# Patient Record
Sex: Male | Born: 1950
Health system: Southern US, Community
[De-identification: ages and names within clinical notes are randomized; demographics above are authoritative.]

## PROBLEM LIST (undated history)

## (undated) DIAGNOSIS — E119 Type 2 diabetes mellitus without complications: Secondary | ICD-10-CM

## (undated) DIAGNOSIS — J189 Pneumonia, unspecified organism: Secondary | ICD-10-CM

## (undated) HISTORY — PX: APPENDECTOMY: SHX54

## (undated) HISTORY — DX: Type 2 diabetes mellitus without complications: E11.9

## (undated) HISTORY — DX: Pneumonia, unspecified organism: J18.9

---

## 1981-01-09 HISTORY — PX: BACK SURGERY: SHX140

## 2003-11-29 ENCOUNTER — Inpatient Hospital Stay: Payer: Self-pay | Admitting: Anesthesiology

## 2004-02-12 ENCOUNTER — Ambulatory Visit: Payer: Self-pay | Admitting: Internal Medicine

## 2010-02-18 ENCOUNTER — Ambulatory Visit: Payer: Self-pay | Admitting: General Practice

## 2010-02-24 ENCOUNTER — Other Ambulatory Visit: Payer: Self-pay | Admitting: Orthopedic Surgery

## 2010-02-24 DIAGNOSIS — M542 Cervicalgia: Secondary | ICD-10-CM

## 2010-02-25 ENCOUNTER — Ambulatory Visit
Admission: RE | Admit: 2010-02-25 | Discharge: 2010-02-25 | Disposition: A | Discharge status: Home / Self Care | Payer: PRIVATE HEALTH INSURANCE | Source: Ambulatory Visit | Attending: Orthopedic Surgery | Admitting: Orthopedic Surgery

## 2010-02-25 DIAGNOSIS — M542 Cervicalgia: Secondary | ICD-10-CM

## 2010-03-29 ENCOUNTER — Other Ambulatory Visit: Payer: Self-pay | Admitting: Orthopedic Surgery

## 2010-03-29 DIAGNOSIS — M542 Cervicalgia: Secondary | ICD-10-CM

## 2010-03-31 ENCOUNTER — Other Ambulatory Visit: Payer: Self-pay | Admitting: Orthopedic Surgery

## 2010-04-01 ENCOUNTER — Other Ambulatory Visit: Payer: PRIVATE HEALTH INSURANCE

## 2010-04-08 ENCOUNTER — Other Ambulatory Visit: Payer: PRIVATE HEALTH INSURANCE

## 2014-08-31 ENCOUNTER — Encounter: Payer: PPO | Attending: Internal Medicine | Admitting: Dietician

## 2014-08-31 ENCOUNTER — Encounter: Payer: Self-pay | Admitting: Dietician

## 2014-08-31 VITALS — BP 137/84 | Ht 69.0 in | Wt 185.7 lb

## 2014-08-31 DIAGNOSIS — E119 Type 2 diabetes mellitus without complications: Secondary | ICD-10-CM | POA: Diagnosis present

## 2014-08-31 DIAGNOSIS — E109 Type 1 diabetes mellitus without complications: Secondary | ICD-10-CM

## 2014-08-31 NOTE — Progress Notes (Signed)
Diabetes Self-Management Education  Visit Type: First/Initial  Appt. Start Time: 1330 Appt. End Time: 1515  08/31/2014  [name], identified by name and date of birth, is a 64 y.o. male with a diagnosis of Diabetes:  (?type 1).   ASSESSMENT  Blood pressure 137/84, height 5\' 9"  (1.753 m), weight 185 lb 11.2 oz (84.233 kg). Body mass index is 27.41 kg/(m^2).      Diabetes Self-Management Education - 08/31/14 1633    Visit Information   Visit Type First/Initial   Initial Visit   Diabetes Type --  ?type 1   Health Coping   How would you rate your overall health? Good   Psychosocial Assessment   Patient Belief/Attitude about Diabetes Motivated to manage diabetes   Self-care barriers None   Self-management support Family   Other persons present Spouse/SO   Patient Concerns Nutrition/Meal planning;Medication;Healthy Lifestyle;Glycemic Control   Special Needs None   Preferred Learning Style Visual;Auditory   Learning Readiness Ready   What is the last grade level you completed in school? 12 + trade school   Complications   Last HgB A1C per patient/outside source 12.6 %  06-17-14   How often do you check your blood sugar? 3-4 times/day  4x/day   Fasting Blood glucose range (mg/dL) --  95-621; ac lunch 30-865; ac supper 76-198; bedtime 104-163   Number of hypoglycemic episodes per month 1   Can you tell when your blood sugar is low? Yes   What do you do if your blood sugar is low? --  drinks fruit juice or eats lifesavers   Number of hyperglycemic episodes per week 1   Can you tell when your blood sugar is high? Yes   What do you do if your blood sugar is high? takes sliding scale Humalog if BG's >150   Have you had a dilated eye exam in the past 12 months? No   Have you had a dental exam in the past 12 months? No   Are you checking your feet? 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 --  2-3 days/wk   Patient Education   Previous Diabetes Education No   Disease state  Definition of diabetes, type 1 and 2, and the diagnosis of diabetes;Factors that contribute to the development of diabetes   Nutrition management  Role of diet in the treatment of diabetes and the relationship between the three main macronutrients and blood glucose level;Food label reading, portion sizes and measuring food.;Carbohydrate counting;Meal timing in regards to the patients' current diabetes medication.   Physical activity and exercise  Role of exercise on diabetes management, blood pressure control and cardiac health.   Medications Taught/reviewed insulin injection, site rotation, insulin storage and needle disposal.;Reviewed patients medication for diabetes, action, purpose, timing of dose and side effects.   Monitoring Purpose and frequency of SMBG.;Taught/discussed recording of test results and interpretation of SMBG.;Identified appropriate SMBG and/or A1C goals.;Yearly dilated eye exam   Acute complications Taught treatment of hypoglycemia - the 15 rule.   Chronic complications Relationship between chronic complications and blood glucose control;Dental care;Retinopathy and reason for yearly dilated eye exams   Personal strategies to promote health Lifestyle issues that need to be  addressed for better diabetes care      Individualized Plan for Diabetes Self-Management Training:   Learning Objective:  Patient will have a greater understanding of diabetes self-management. Patient education plan is to attend individual and/or group sessions per assessed needs and concerns.   Plan:   Patient Instructions   Check blood sugars 4 x day before each meal and before bed every day and record  Avoid sugar sweetened drinks (soda, tea, coffee, sports drinks, juices) and limit intake of  sweets and fried foods  Eat 3 meals day,  3  snacks a day  Space meals 4-6 hours apart  Complete 3 Day Food Record and bring to next appt  Make a podiatrist / dentist / eye doctor appointment  Bring blood sugar records to the next appointment  Carry fast acting glucose and a snack at all times  Rotate injection sites  Carry medical ID at all times  Return for appointment/classes on:  09-18-14   Expected Outcomes:   Good  Education material provided: General meal planning guidelines, medical alert ID card, 1 roll of glucose tablets for use if needed, 1 pack of 4mm pen needles, 1 pack 6mm 30 unit insulin syringes, list of diabetes websites and apps  If problems or questions, patient to contact team via:  [phone]  Future DSME appointment:  09-18-14

## 2014-08-31 NOTE — Patient Instructions (Addendum)
  Check blood sugars 4 x day before each meal and before bed every day and record  Avoid sugar sweetened drinks (soda, tea, coffee, sports drinks, juices) and limit intake of sweets and fried foods  Eat 3 meals day,  3  snacks a day  Space meals 4-6 hours apart  Complete 3 Day Food Record and bring to next appt  Make a podiatrist / dentist / eye doctor appointment  Bring blood sugar records to the next appointment  Carry fast acting glucose and a snack at all times  Rotate injection sites  Carry medical ID at all times  Return for appointment/classes on:  09-18-14

## 2014-09-08 ENCOUNTER — Encounter: Payer: PPO | Admitting: Dietician

## 2014-09-08 VITALS — Wt 184.4 lb

## 2014-09-08 DIAGNOSIS — E119 Type 2 diabetes mellitus without complications: Secondary | ICD-10-CM | POA: Diagnosis not present

## 2014-09-08 NOTE — Progress Notes (Signed)
Diabetes Self-Management Education  Visit Type:  Follow-up  Appt. Start Time: 13;30 Appt. End Time: 14:30  09/08/2014  [name], identified by name and date of birth, is a 64 y.o. male with a diagnosis of Diabetes:  .   ASSESSMENT  Weight 184 lb 6.4 oz (83.643 kg). Body mass index is 27.22 kg/(m^2).       Diabetes Self-Management Education - 09/08/14 1457    Complications   Last HgB A1C per patient/outside source 7.6 %   How often do you check your blood sugar? 3-4 times/day   Fasting Blood glucose range (mg/dL) 60-454   Postprandial Blood glucose range (mg/dL) 098-119   Have you had a dilated eye exam in the past 12 months? No   Have you had a dental exam in the past 12 months? No   Are you checking your feet? No   Dietary Intake   Breakfast 3 eggs, 1 oz. shredded cheese, 1 slice ww toast, 1 tsp honey, 8 oz soy milk, black coffee    Snack (morning) toast or crackers, pecans or peanut butter, fruit   Lunch 4-6 oz. chicken or pork for ex., 3/4 to 1 cup starch such as potatoes or sweet potatoes, non-starchy veg.   Snack (afternoon) 1 cup cottage cheese, 2 pear halves or apple/peanut butter   Dinner 4 oz. 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   Acute complications Taught treatment of hypoglycemia - the 15 rule.   Chronic  complications Lipid levels, blood glucose control and heart disease      Learning Objective:  Patient will have a greater understanding of diabetes self-management. Patient education plan is to attend individual and/or group sessions per assessed needs and concerns.   Plan:   Patient Instructions  Decrease from 3 eggs per day to 2 + egg whites. Spread 13-15 servings of carbohydrate over 3 meals and 2-3 snacks. Balance meals with protein, 3-4 servings of carbohydrate and "free vegetables". Read labels for carbohydrate; can subtract fiber. Include 10 oz. protein per day.   Education material provided: Planning a Balanced Meal and 3 day menus If problems or questions, patient to contact team via:  Phone: Margit Hanks [phone] Future DSME appointment: -   Sept. 12, 2016 at 13:00 with [place] Blas, RN

## 2014-09-08 NOTE — Patient Instructions (Signed)
Decrease from 3 eggs per day to 2 + egg whites. Spread 13-15 servings of carbohydrate over 3 meals and 2-3 snacks. Balance meals with protein, 3-4 servings of carbohydrate and "free vegetables". Read labels for carbohydrate; can subtract fiber. Include 10 oz. protein per day.

## 2014-09-18 ENCOUNTER — Ambulatory Visit: Payer: PPO | Admitting: Dietician

## 2014-09-21 ENCOUNTER — Encounter: Payer: Self-pay | Admitting: Dietician

## 2014-09-21 ENCOUNTER — Encounter: Payer: PPO | Attending: Internal Medicine | Admitting: Dietician

## 2014-09-21 VITALS — Wt 186.2 lb

## 2014-09-21 DIAGNOSIS — E119 Type 2 diabetes mellitus without complications: Secondary | ICD-10-CM | POA: Diagnosis present

## 2014-09-21 DIAGNOSIS — E109 Type 1 diabetes mellitus without complications: Secondary | ICD-10-CM

## 2014-09-21 NOTE — Progress Notes (Signed)
Diabetes Self-Management Education  Visit Type:  Follow-up  Appt. Start Time:1330  Appt. End Time: 1430  09/21/2014-Pt is doing well-BG's in overall good control with only occasional low BG (50's-60's)  and occasional high  BG (diet related)-reports that Humalog has been switched to Novolin R due to poor coverage by insurance-pt now takes Novolin R 10 units TID ac meals-pt reports that he feels like he has to eat all the time and  often will have a low BG if he does not eat a snack 2-2.5 hr after eating meals. We discussed the possibility that he Oboyle need less R insulin at mealtimes -to discuss with MD. We also discussed using a more intensive regimen with his insulin-using an ICR for meal doses + a  sliding scale if needed for correction. Discussed the possibility that BG's Salatino eventually go up as honeymoon period progresses.  Jason Cain, identified by name and date of birth, is a 64 y.o. male with a diagnosis of Diabetes:    ASSESSMENT  Weight 186 lb 3.2 oz (84.46 kg). Body mass index is 27.48 kg/(m^2).       Diabetes Self-Management Education - 09/21/14 1456    Complications   How often do you check your blood sugar? 3-4 times/day   Fasting Blood glucose range (mg/dL) 16-109  ac lunch 60-454; ac supper 58-144   Number of hypoglycemic episodes per month 3   Can you tell when your blood sugar is low? Yes   What do you do if your blood sugar is low? --  eats lifesavers   Number of hyperglycemic episodes per week 2   Have you had a dilated eye exam in the past 12 months? No   Have you had a dental exam in the past 12 months? No   Are you checking your feet? Yes   How many days per week are you checking your feet? 7   Dietary Intake   Breakfast --  eats 3 meals/day and 3 snacks   Beverage(s) --  drinks mostly water   Exercise   Exercise Type --  farmwork/yardwork    Patient Education   Disease state  Definition of diabetes, type 1 and 2, and the diagnosis of diabetes;Factors that  contribute to the development of diabetes   Nutrition management  Food label reading, portion sizes and measuring food.;Carbohydrate counting;Meal timing in regards to the patients' current diabetes medication.   Physical activity and exercise  Role of exercise on diabetes management, blood pressure control and cardiac health.   Medications Taught/reviewed insulin injection, site rotation, insulin storage and needle disposal.;Reviewed patients medication for diabetes, action, purpose, timing of dose and side effects.   Monitoring Ketone testing, when, how.;Yearly dilated eye exam;Daily foot exams;Identified appropriate SMBG and/or A1C goals.   Acute complications Taught treatment of hypoglycemia - the 15 rule.;Covered sick day management with medication and food.   Chronic complications Relationship between chronic complications and blood glucose control;Dental care;Retinopathy and reason for yearly dilated eye exams;Assessed and discussed foot care and prevention of foot problems  advised to get flu vaccine yearly; good BG control to promote sexual health   Psychosocial adjustment Role of stress on diabetes;Helped patient identify a support system for diabetes management      Learning Objective:  Patient will have a greater understanding of diabetes self-management. Patient education plan is to attend individual and/or group sessions per assessed needs and concerns.   Plan:  Complete a 3 day food record and bring to next  appointment with a dietitian on 10-09-14 (to teach carbohydrate counting) Carry candy or glucose tablets at all times Carry medical alert ID at all times Consider getting flu vaccine   Expected Outcomes:     Education material provided: JDRF Adult Type 1 Toolkit, A1C handout, foot care handout, medical alert ID card  If problems or questions, patient to contact team via:  [phone]  Future DSME appointment: 10-09-14

## 2014-09-21 NOTE — Patient Instructions (Signed)
Complete a 3 day food record and bring to next appointment with dietitian on 10-09-14 Carry candy or glucose tablets at all times Carry medical alert ID  Consider getting flu vaccine

## 2014-10-09 ENCOUNTER — Ambulatory Visit: Payer: PPO | Admitting: Dietician

## 2014-10-30 ENCOUNTER — Encounter: Payer: Self-pay | Admitting: Dietician

## 2014-10-30 ENCOUNTER — Encounter: Payer: PPO | Attending: Internal Medicine | Admitting: Dietician

## 2014-10-30 VITALS — BP 136/76 | Ht 69.0 in | Wt 183.9 lb

## 2014-10-30 DIAGNOSIS — E119 Type 2 diabetes mellitus without complications: Secondary | ICD-10-CM | POA: Insufficient documentation

## 2014-10-30 NOTE — Patient Instructions (Signed)
Patient to include minimum of 2 servings of carbohydrate per meal with 3-4 servings within recommended range. To spread 13-15 servings of carbohydrate over 3 meals and 1-2 snacks. Since patient eats large quantity of steamed non-starchy vegetables (1.5 to 2 cups), can count as carbohydrate serving but to include at least 1 starch in addition. If bedtime glucose readings are less than 120, include a snack that contains 15 gms of carbohydrate and at l-2 ounces of protein. To read labels for saturated fat, trans fat and sodium.

## 2014-10-30 NOTE — Progress Notes (Signed)
Diabetes Self-Management Education  Visit Type:  Follow-up  Appt. Start Time:13:30 Appt. End Time: 14:30  10/30/2014  [name], identified by name and date of birth, is a 64 y.o. male with a diagnosis of Diabetes: type 2  .   ASSESSMENT Patient accompanied by his wife in for 1:1 follow-up visit. Records indicate good blood glucose control. By continuing to improve eating habits, he has lost 2.3 lbs in past 2 weeks. He is comfortable with his present weight. 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 Discussed when to include snacks. Also, reviewed prevention and treatment of hypoglycemia as patient continues to occasionally have blood sugars below 70 and often has symptoms when glucose is in 80's-90's. Also, reviewed sick day guidelines.   Blood pressure 136/76, height 5\' 9"  (1.753 m), weight 183 lb 14.4 oz (83.416 kg). Body mass index is 27.14 kg/(m^2).       Diabetes Self-Management Education - 10/30/14 1723    Complications   Last HgB A1C per patient/outside source 7.6 %   How often do you check your blood sugar? 3-4 times/day   Fasting Blood glucose range (mg/dL) 16-10970-129   Postprandial Blood glucose range (mg/dL) --  most bedtime readings are less than 160   Have you had a dilated eye exam in the past 12 months? No   Have you had a dental exam in the past 12 months? No   Are you checking your feet? Yes   How many days per week are you checking your feet? 7   Dietary Intake   Breakfast 2 eggs, 2 slices low sodium bacon, 1 slice toast/margarine, 1 Tbsp honey, soy milk, coffee   Snack (morning) crackers; 10-15 gms carb. on average   Lunch meat, starch, variety of non-starchy vegetables   Snack (afternoon) several shortbread cookies   Dinner similar to lunch. 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   Acute complications Taught treatment of hypoglycemia - the 15 rule.;Discussed and identified patients' treatment of hyperglycemia.;Covered sick day management with medication and food.   Chronic complications Relationship between chronic complications and blood glucose control;Lipid levels, blood glucose control and heart disease   Outcomes   Program Status Completed      Learning Objective:  Patient will have a greater understanding of diabetes self-management. Patient education plan is to attend individual and/or group sessions per assessed needs and concerns.   Plan:   Patient Instructions  Patient to include minimum of 2 servings of carbohydrate per meal with 3-4 servings within recommended range. To spread 13-15 servings of carbohydrate over 3 meals and 1-2 snacks. Since patient eats large quantity of steamed non-starchy vegetables (1.5 to 2 cups), can count as carbohydrate serving but to include at least 1 starch in addition. If bedtime glucose readings are less than 120, include a snack that contains 15 gms of carbohydrate and at  l-2 ounces of protein. To read labels for saturated fat, trans fat and sodium.  Expected Outcomes:  Demonstrated interest in learning. Expect positive outcomes  Education material provided: Nutrition Prescription  If problems or questions, patient to contact team via:  To call with any further questions: [phone] Future DSME appointment: - PRN

## 2015-01-22 DIAGNOSIS — E109 Type 1 diabetes mellitus without complications: Secondary | ICD-10-CM | POA: Diagnosis not present

## 2015-02-09 DIAGNOSIS — E109 Type 1 diabetes mellitus without complications: Secondary | ICD-10-CM | POA: Diagnosis not present

## 2015-04-30 DIAGNOSIS — E109 Type 1 diabetes mellitus without complications: Secondary | ICD-10-CM | POA: Diagnosis not present

## 2015-05-10 DIAGNOSIS — E109 Type 1 diabetes mellitus without complications: Secondary | ICD-10-CM | POA: Diagnosis not present

## 2015-06-22 DIAGNOSIS — E109 Type 1 diabetes mellitus without complications: Secondary | ICD-10-CM | POA: Diagnosis not present

## 2015-06-22 DIAGNOSIS — Z6825 Body mass index (BMI) 25.0-25.9, adult: Secondary | ICD-10-CM | POA: Diagnosis not present

## 2015-06-22 DIAGNOSIS — R5383 Other fatigue: Secondary | ICD-10-CM | POA: Diagnosis not present

## 2015-07-08 DIAGNOSIS — M79642 Pain in left hand: Secondary | ICD-10-CM | POA: Diagnosis not present

## 2015-07-08 DIAGNOSIS — R5382 Chronic fatigue, unspecified: Secondary | ICD-10-CM | POA: Diagnosis not present

## 2015-07-08 DIAGNOSIS — M79641 Pain in right hand: Secondary | ICD-10-CM | POA: Insufficient documentation

## 2015-08-19 DIAGNOSIS — E109 Type 1 diabetes mellitus without complications: Secondary | ICD-10-CM | POA: Diagnosis not present

## 2015-08-24 DIAGNOSIS — E109 Type 1 diabetes mellitus without complications: Secondary | ICD-10-CM | POA: Diagnosis not present

## 2015-10-07 DIAGNOSIS — M79641 Pain in right hand: Secondary | ICD-10-CM

## 2015-10-07 DIAGNOSIS — G8929 Other chronic pain: Secondary | ICD-10-CM | POA: Diagnosis not present

## 2015-10-07 DIAGNOSIS — M67441 Ganglion, right hand: Secondary | ICD-10-CM | POA: Insufficient documentation

## 2015-11-09 ENCOUNTER — Ambulatory Visit: Payer: PPO | Admitting: Cardiovascular Disease

## 2015-12-16 DIAGNOSIS — E109 Type 1 diabetes mellitus without complications: Secondary | ICD-10-CM | POA: Diagnosis not present

## 2015-12-28 DIAGNOSIS — E109 Type 1 diabetes mellitus without complications: Secondary | ICD-10-CM | POA: Diagnosis not present

## 2016-03-06 DIAGNOSIS — J301 Allergic rhinitis due to pollen: Secondary | ICD-10-CM | POA: Diagnosis not present

## 2016-03-06 DIAGNOSIS — R5383 Other fatigue: Secondary | ICD-10-CM | POA: Diagnosis not present

## 2016-03-06 DIAGNOSIS — Z6828 Body mass index (BMI) 28.0-28.9, adult: Secondary | ICD-10-CM | POA: Diagnosis not present

## 2016-03-06 DIAGNOSIS — J45909 Unspecified asthma, uncomplicated: Secondary | ICD-10-CM | POA: Diagnosis not present

## 2016-03-06 DIAGNOSIS — M791 Myalgia: Secondary | ICD-10-CM | POA: Diagnosis not present

## 2016-04-24 ENCOUNTER — Ambulatory Visit (INDEPENDENT_AMBULATORY_CARE_PROVIDER_SITE_OTHER): Payer: PPO | Admitting: Internal Medicine

## 2016-04-24 ENCOUNTER — Ambulatory Visit
Admission: RE | Admit: 2016-04-24 | Discharge: 2016-04-24 | Disposition: A | Discharge status: Home / Self Care | Payer: PPO | Source: Ambulatory Visit | Attending: Internal Medicine | Admitting: Internal Medicine

## 2016-04-24 ENCOUNTER — Other Ambulatory Visit: Payer: Self-pay | Admitting: Hematology and Oncology

## 2016-04-24 ENCOUNTER — Other Ambulatory Visit: Payer: Self-pay

## 2016-04-24 ENCOUNTER — Encounter: Payer: Self-pay | Admitting: Internal Medicine

## 2016-04-24 VITALS — BP 130/86 | HR 89 | Wt 186.0 lb

## 2016-04-24 DIAGNOSIS — R0600 Dyspnea, unspecified: Secondary | ICD-10-CM | POA: Diagnosis not present

## 2016-04-24 DIAGNOSIS — J4520 Mild intermittent asthma, uncomplicated: Secondary | ICD-10-CM

## 2016-04-24 DIAGNOSIS — R0609 Other forms of dyspnea: Secondary | ICD-10-CM | POA: Insufficient documentation

## 2016-04-24 MED ORDER — ALBUTEROL SULFATE HFA 108 (90 BASE) MCG/ACT IN AERS: 2.0000 | INHALATION_SPRAY | RESPIRATORY_TRACT | 3 refills | Status: DC | PRN

## 2016-04-24 MED ORDER — FLUTICASONE PROPIONATE HFA 110 MCG/ACT IN AERO: 2.0000 | INHALATION_SPRAY | Freq: Two times a day (BID) | RESPIRATORY_TRACT | 5 refills | Status: DC

## 2016-04-24 NOTE — Progress Notes (Signed)
[facility] [place] Pulmonary Medicine Consultation      Date: 04/24/2016,   MRN# [id number] Coury Grieger Dieckman 1950-03-16 Code Status:  Code Status History    This patient does not have a recorded code status. Please follow your organizational policy for patients in this situation.     Hosp day:@LENGTHOFSTAYDAYS @ Referring MD: @     PCP:      AdmissionWeight: 186 lb (84.4 kg)                 CurrentWeight: 186 lb (84.4 kg) Yannick Steuber Walpole is a 66 y.o. old male seen in consultation for  at the request of Dr. Quillian Quince.     CHIEF COMPLAINT:   Cough and chest congestion   HISTORY OF PRESENT ILLNESS   66 yo white male seen today for worsening symptoms of chest congestion Patient had been dx with ASTHMA around 2010 his symptoms of wheezing and chest congestion/cough were well controlled with Flovent 44 BID,   He stopped using inhalers in 2014.  Patient comes today with symptoms of ASTHMA with chronic mucus production and productive cough with intermittent wheezing and chest congestion His symptoms have worsened with exertion Patient is non smoker, no second hand smoke exposure Patient is very active, he is retired Investment banker, corporate  He has no signs of infection at this time No lower ext swelling Office Spiro  04/24/2016 Ratio 52% Fev1 1.29L 40% predicted FVC 2.49L 58% predicted  Offcie   PAST MEDICAL HISTORY   Past Medical History:  Diagnosis Date  . Diabetes (HCC)   . Pneumonia      SURGICAL HISTORY   Past Surgical History:  Procedure Laterality Date  . APPENDECTOMY    . BACK SURGERY  1983     FAMILY HISTORY   Family History  Problem Relation Age of Onset  . Asthma Mother      SOCIAL HISTORY   Social History  Substance Use Topics  . Smoking status: Never Smoker  . Smokeless tobacco: Never Used  . Alcohol use No     MEDICATIONS    Home Medication:  Current Outpatient Rx  . Order #: [id number] Class: Historical Med  . Order #: [id number] Class: Historical Med   . Order #: [id number] Class: Historical Med  . Order #: [id number] Class: Historical Med  . Order #: [id number] Class: Historical Med  . Order #: [id number] Class: Historical Med  . Order #: [id number] Class: Historical Med  . Order #: [id number] Class: Historical Med    Current Medication:  Current Outpatient Prescriptions:  .  acetaminophen (TYLENOL) 325 MG tablet, Take 650 mg by mouth daily as needed. Doe pain, Disp: , Rfl:  .  Cholecalciferol (VITAMIN D3) 2000 UNITS capsule, Take 2,000 capsules by mouth daily., Disp: , Rfl:  .  GLUCAGON EMERGENCY 1 MG injection, Inject 1 mg into the muscle as needed. Inject if needed for hypoglycemia, Disp: , Rfl: 0 .  insulin glargine (LANTUS) 100 UNIT/ML injection, Inject 20 Units into the skin at bedtime. , Disp: , Rfl:  .  insulin lispro (HUMALOG) 100 UNIT/ML KiwkPen, Inject 6 Units into the skin 3 (three) times daily., Disp: , Rfl:  .  MAGNESIUM PO, Take 325 mg by mouth daily., Disp: , Rfl:  .  Multiple Vitamins-Minerals (ONE DAILY MINERALS PO), Take 1 oz by mouth daily., Disp: , Rfl:  .  NOVOLIN R 100 UNIT/ML injection, Inject 10 Units into the skin 3 (three) times daily before meals., Disp: , Rfl: 5    ALLERGIES   Codeine  REVIEW OF SYSTEMS   Review of Systems  Constitutional: Negative for chills, diaphoresis, fever, malaise/fatigue and weight loss.  HENT: Negative for congestion and hearing loss.   Eyes: Negative for blurred vision and double vision.  Respiratory: Positive for cough, shortness of breath and wheezing.   Cardiovascular: Negative for chest pain, palpitations and orthopnea.  Gastrointestinal: Negative for abdominal pain, blood in stool, constipation, diarrhea, heartburn, nausea and vomiting.  Genitourinary: Negative for dysuria and urgency.  Musculoskeletal: Negative for back pain, myalgias and neck pain.  Skin: Negative for rash.  Neurological: Negative for dizziness, tingling, tremors, weakness and headaches.    Endo/Heme/Allergies: Does not bruise/bleed easily.  Psychiatric/Behavioral: Negative for depression, substance abuse and suicidal ideas.  All other systems reviewed and are negative.    VS: BP 130/86 (BP Location: Left Arm, Cuff Size: Normal)   Pulse 89   Wt 186 lb (84.4 kg)   SpO2 97%   BMI 27.47 kg/m      PHYSICAL EXAM  Physical Exam  Constitutional: He is oriented to person, place, and time. He appears well-developed and well-nourished. No distress.  HENT:  Head: Normocephalic and atraumatic.  Mouth/Throat: No oropharyngeal exudate.  Eyes: EOM are normal. Pupils are equal, round, and reactive to light. No scleral icterus.  Neck: Normal range of motion. Neck supple.  Cardiovascular: Normal rate, regular rhythm and normal heart sounds.   No murmur heard. Pulmonary/Chest: No stridor. No respiratory distress. He has no wheezes.  Abdominal: Soft. Bowel sounds are normal.  Musculoskeletal: Normal range of motion. He exhibits no edema.  Neurological: He is alert and oriented to person, place, and time. No cranial nerve deficit.  Skin: Skin is warm. He is not diaphoretic.  Psychiatric: He has a normal mood and affect.     Office Cleda Daub 04/24/2016 Ratio 52% Fev1 1.29L 40% predicted FVC 2.49L 58% predicted       ASSESSMENT/PLAN   66 yo pleasant white male seen today for signs and symptoms of reactive airways disease likely underlying ASTHMA Moderate Persistent  1.will start Flovent 110 BID 2.albuterol as needed 3.check CXR Pa/LAT  Follow up in 1 month to assess resp status    Patient/Family are satisfied with Plan of action and management. All questions answered  Lucie Leather, M.D.  [facility]  Medical Director [facility] Medical Director [facility] Cardio-Pulmonary Department

## 2016-04-24 NOTE — Patient Instructions (Signed)
Start Flovent 110 mcg 2 puffs twice daily ALbuterol as needed Check CXR PA/LAT

## 2016-04-25 ENCOUNTER — Encounter: Payer: Self-pay | Admitting: Internal Medicine

## 2016-04-27 ENCOUNTER — Telehealth: Payer: Self-pay | Admitting: Internal Medicine

## 2016-04-27 ENCOUNTER — Telehealth: Payer: Self-pay | Admitting: *Deleted

## 2016-04-27 NOTE — Telephone Encounter (Signed)
Please advise on CXR results. Thanks! 

## 2016-04-27 NOTE — Telephone Encounter (Signed)
Please call with cxray results .

## 2016-04-27 NOTE — Telephone Encounter (Signed)
Received request from [employer] for the Proventil. Paperwork filled out a faxed back to [employer]. Pt request Proventil vs Proair due to pt stating he has gotten hives before. Will await response.  EOC ID: [id number]

## 2016-04-28 NOTE — Telephone Encounter (Signed)
Received paperwork back from [facility] stating that the Proventil is already covered under pt's plan. Informed pt's wife. States she will contact the pharmacy. Nothing further needed.

## 2016-05-01 DIAGNOSIS — R253 Fasciculation: Secondary | ICD-10-CM | POA: Diagnosis not present

## 2016-05-01 NOTE — Telephone Encounter (Signed)
CXR results called to patient this AM, satisfied with plan of care

## 2016-05-02 DIAGNOSIS — R899 Unspecified abnormal finding in specimens from other organs, systems and tissues: Secondary | ICD-10-CM | POA: Diagnosis not present

## 2016-05-05 ENCOUNTER — Telehealth: Payer: Self-pay | Admitting: Internal Medicine

## 2016-05-05 NOTE — Telephone Encounter (Signed)
Called [employer] and the lady I spoke with states that they made an error and that the Proventil is not covered that the Proair is. Pt unable to take Proair so the lady stated she wasn't clinically and would have some one to return my call. Ticket # [id number]. Will await call back from [facility] [facility].

## 2016-05-05 NOTE — Telephone Encounter (Signed)
[facility] [employer] calling asking for a PA over the phone  Proventil inhaler  She has a couple questions for this  Please call back

## 2016-05-08 NOTE — Telephone Encounter (Signed)
Will call [facility] again due to have been told we would receive a call back.   Called back and this lady states she will place another message asking someone from clinically dept to call back. Informed her that on 05/05/16 I was informed someone would call back and didn't.

## 2016-05-08 NOTE — Telephone Encounter (Signed)
Received form stating Proventil has been denied. Will have scanned into chart.

## 2016-05-08 NOTE — Telephone Encounter (Signed)
Received more forms to fill out and to fax back. Will fill out and wait on response.

## 2016-05-09 DIAGNOSIS — R253 Fasciculation: Secondary | ICD-10-CM | POA: Diagnosis not present

## 2016-05-09 NOTE — Telephone Encounter (Signed)
Spoke with [facility] by calling the number on the back of card which is [phone]. Lady states as of 05/08/16 it has went into an appeal. She states she has changed it to an Urgent request. States it can take up to 7 days before a response.   Reference ID: [id number].

## 2016-05-10 NOTE — Telephone Encounter (Signed)
Received documentation from [facility] stating that Proventil has been approved from 05/09/16 to 01/08/17. Approval faxed to pharmacy.

## 2016-05-16 ENCOUNTER — Other Ambulatory Visit: Payer: Self-pay | Admitting: Neurology

## 2016-05-16 DIAGNOSIS — R253 Fasciculation: Secondary | ICD-10-CM

## 2016-05-24 ENCOUNTER — Encounter: Payer: Self-pay | Admitting: *Deleted

## 2016-05-24 ENCOUNTER — Encounter: Payer: Self-pay | Admitting: Internal Medicine

## 2016-05-25 ENCOUNTER — Encounter: Payer: Self-pay | Admitting: Internal Medicine

## 2016-05-25 ENCOUNTER — Ambulatory Visit (INDEPENDENT_AMBULATORY_CARE_PROVIDER_SITE_OTHER): Payer: PPO | Admitting: Internal Medicine

## 2016-05-25 VITALS — BP 138/84 | HR 74 | Resp 16 | Ht 69.0 in | Wt 182.0 lb

## 2016-05-25 DIAGNOSIS — J4520 Mild intermittent asthma, uncomplicated: Secondary | ICD-10-CM

## 2016-05-25 NOTE — Progress Notes (Signed)
[facility] Pulmonary Medicine Consultation      Date: 05/25/2016,   MRN# [id number] Jason Cain [birth year] Code Status:  Code Status History    This patient does not have a recorded code status. Please follow your organizational policy for patients in this situation.     Hosp day:@LENGTHOFSTAYDAYS @ Referring MD: @ATDPROV @     PCP:      AdmissionWeight: 182 lb (82.6 kg)                 CurrentWeight: 182 lb (82.6 kg) Jason Cain is a 66 y.o. old male seen in consultation for  at the request of Dr. Quillian Quince.     CHIEF COMPLAINT:  Follow up ASTHMA   HISTORY OF PRESENT ILLNESS   Patient feels 90% better with Flovent and doing really well No signs of infection at this time  Patient is non smoker, no second hand smoke exposure Patient is very active, he is retired Investment banker, corporate  He has no signs of infection at this time No lower ext swelling Office Spiro  04/24/2016 Ratio 52% Fev1 1.29L 40% predicted FVC 2.49L 58% predicted  Offcie    Current Medication:  Current Outpatient Prescriptions:  .  albuterol (PROVENTIL HFA;VENTOLIN HFA) 108 (90 Base) MCG/ACT inhaler, Inhale 2 puffs into the lungs every 4 (four) hours as needed for wheezing or shortness of breath., Disp: 1 Inhaler, Rfl: 3 .  Cholecalciferol (VITAMIN D3) 2000 UNITS capsule, Take 2,000 capsules by mouth daily., Disp: , Rfl:  .  fluticasone (FLOVENT HFA) 110 MCG/ACT inhaler, Inhale 2 puffs into the lungs 2 (two) times daily., Disp: 1 Inhaler, Rfl: 5 .  GLUCAGON EMERGENCY 1 MG injection, Inject 1 mg into the muscle as needed. Inject if needed for hypoglycemia, Disp: , Rfl: 0 .  insulin aspart (NOVOLOG FLEXPEN) 100 UNIT/ML FlexPen, Inject 30 Units into the skin 3 (three) times daily with meals., Disp: , Rfl:  .  insulin glargine (LANTUS) 100 UNIT/ML injection, Inject 20 Units into the skin at bedtime. , Disp: , Rfl:  .  MAGNESIUM PO, Take 325 mg by mouth daily., Disp: , Rfl:  .  Multiple Vitamins-Minerals (ONE DAILY  MINERALS PO), Take 1 oz by mouth daily., Disp: , Rfl:  .  ONE TOUCH ULTRA TEST test strip, 1 each by Other route daily., Disp: , Rfl: 3    ALLERGIES   Codeine     REVIEW OF SYSTEMS   Review of Systems  Constitutional: Negative for chills, diaphoresis, fever, malaise/fatigue and weight loss.  HENT: Negative for congestion and hearing loss.   Eyes: Negative for blurred vision and double vision.  Respiratory: Negative for cough, hemoptysis, sputum production, shortness of breath and wheezing.   Cardiovascular: Negative for chest pain, palpitations and orthopnea.  Skin: Negative for rash.  Neurological: Negative for weakness.  All other systems reviewed and are negative.    VS: BP 138/84 (BP Location: Right Arm, Cuff Size: Normal)   Pulse 74   Resp 16   Ht 5\' 9"  (1.753 m)   Wt 182 lb (82.6 kg)   SpO2 100%   BMI 26.88 kg/m      PHYSICAL EXAM  Physical Exam  Constitutional: He is oriented to person, place, and time. No distress.  HENT:  Head: Atraumatic.  Neck: Neck supple.  Cardiovascular: Normal rate, regular rhythm and normal heart sounds.   No murmur heard. Pulmonary/Chest: Effort normal and breath sounds normal. No stridor. No respiratory distress. He has no wheezes.  Musculoskeletal: Normal range of motion. He exhibits no edema.  Neurological: He is alert and oriented to person, place, and time. No cranial nerve deficit.  Skin: He is not diaphoretic.  Psychiatric: He has a normal mood and affect.     Office Cleda DaubSpiro 05/25/2016 Ratio 52% Fev1 1.29L 40% predicted FVC 2.49L 58% predicted       ASSESSMENT/PLAN   66 yo pleasant white male seen today for signs and symptoms of reactive airways disease likely underlying ASTHMA now with Mild Intermittent.  1.will continue Flovent 110 BID 2.albuterol as needed   Follow up in 3 months to assess resp status    Patient/Family are satisfied with Plan of action and management. All questions answered  Lucie LeatherKurian  David Olyver Hawes, M.D.  [facility]  Medical Director [facility] Medical Director [facility] Card[facility]

## 2016-05-25 NOTE — Patient Instructions (Signed)
Continue Flovent as prescribed 

## 2016-05-29 ENCOUNTER — Institutional Professional Consult (permissible substitution): Payer: PPO | Admitting: Internal Medicine

## 2016-05-29 ENCOUNTER — Ambulatory Visit
Admission: RE | Admit: 2016-05-29 | Discharge: 2016-05-29 | Disposition: A | Discharge status: Home / Self Care | Payer: PPO | Source: Ambulatory Visit | Attending: Neurology | Admitting: Neurology

## 2016-05-29 DIAGNOSIS — M5126 Other intervertebral disc displacement, lumbar region: Secondary | ICD-10-CM | POA: Diagnosis not present

## 2016-05-29 DIAGNOSIS — M48061 Spinal stenosis, lumbar region without neurogenic claudication: Secondary | ICD-10-CM | POA: Insufficient documentation

## 2016-05-29 DIAGNOSIS — R253 Fasciculation: Secondary | ICD-10-CM

## 2016-05-29 DIAGNOSIS — M5417 Radiculopathy, lumbosacral region: Secondary | ICD-10-CM | POA: Diagnosis not present

## 2016-05-29 DIAGNOSIS — M5136 Other intervertebral disc degeneration, lumbar region: Secondary | ICD-10-CM | POA: Diagnosis not present

## 2016-06-08 DIAGNOSIS — M5417 Radiculopathy, lumbosacral region: Secondary | ICD-10-CM | POA: Diagnosis not present

## 2016-06-08 DIAGNOSIS — R253 Fasciculation: Secondary | ICD-10-CM | POA: Diagnosis not present

## 2016-06-08 DIAGNOSIS — R252 Cramp and spasm: Secondary | ICD-10-CM | POA: Diagnosis not present

## 2016-06-26 DIAGNOSIS — E109 Type 1 diabetes mellitus without complications: Secondary | ICD-10-CM | POA: Diagnosis not present

## 2016-06-28 DIAGNOSIS — E109 Type 1 diabetes mellitus without complications: Secondary | ICD-10-CM | POA: Diagnosis not present

## 2016-08-03 ENCOUNTER — Other Ambulatory Visit
Admission: RE | Admit: 2016-08-03 | Discharge: 2016-08-03 | Disposition: A | Discharge status: Home / Self Care | Payer: PPO | Source: Ambulatory Visit | Attending: Nurse Practitioner | Admitting: Nurse Practitioner

## 2016-08-03 DIAGNOSIS — Z8739 Personal history of other diseases of the musculoskeletal system and connective tissue: Secondary | ICD-10-CM | POA: Diagnosis not present

## 2016-08-03 DIAGNOSIS — R5382 Chronic fatigue, unspecified: Secondary | ICD-10-CM | POA: Diagnosis not present

## 2016-08-03 DIAGNOSIS — R252 Cramp and spasm: Secondary | ICD-10-CM | POA: Diagnosis not present

## 2016-08-03 LAB — CKMB (ARMC ONLY): CK, MB: 5.7 ng/mL — ABNORMAL HIGH (ref 0.5–5.0)

## 2016-08-03 LAB — CK: CK TOTAL: 214 U/L (ref 49–397)

## 2016-08-14 DIAGNOSIS — R5383 Other fatigue: Secondary | ICD-10-CM | POA: Diagnosis not present

## 2016-10-09 ENCOUNTER — Telehealth: Payer: Self-pay | Admitting: Internal Medicine

## 2016-10-09 NOTE — Telephone Encounter (Signed)
Please advise of available appt for August recall.

## 2016-10-10 ENCOUNTER — Telehealth: Payer: Self-pay | Admitting: *Deleted

## 2016-10-10 NOTE — Telephone Encounter (Signed)
Left message to return call in regards to f/u appt.

## 2016-10-11 NOTE — Telephone Encounter (Signed)
Patient has been scheduled

## 2016-10-26 ENCOUNTER — Ambulatory Visit: Payer: PPO | Admitting: Internal Medicine

## 2016-11-09 ENCOUNTER — Encounter: Payer: Self-pay | Admitting: Internal Medicine

## 2016-11-09 ENCOUNTER — Ambulatory Visit: Payer: PPO | Admitting: Internal Medicine

## 2016-11-16 ENCOUNTER — Encounter: Payer: Self-pay | Admitting: Internal Medicine

## 2016-11-17 ENCOUNTER — Ambulatory Visit: Payer: PPO | Admitting: Internal Medicine

## 2016-11-17 ENCOUNTER — Ambulatory Visit: Payer: PPO | Admitting: Pulmonary Disease

## 2016-11-21 ENCOUNTER — Encounter: Payer: Self-pay | Admitting: Internal Medicine

## 2016-11-21 ENCOUNTER — Ambulatory Visit: Payer: PPO | Admitting: Internal Medicine

## 2016-11-21 VITALS — BP 124/88 | HR 86 | Ht 69.0 in | Wt 191.0 lb

## 2016-11-21 DIAGNOSIS — J4520 Mild intermittent asthma, uncomplicated: Secondary | ICD-10-CM | POA: Diagnosis not present

## 2016-11-21 DIAGNOSIS — G4719 Other hypersomnia: Secondary | ICD-10-CM | POA: Diagnosis not present

## 2016-11-21 NOTE — Patient Instructions (Signed)
RESTART FLOVENT AS PRESCRIBED WILL NEED SLEEP STUDY TO ASSESS FOR SLEEP APNEA AVOID IRRITANTS

## 2016-11-21 NOTE — Progress Notes (Signed)
[facility]      Date: 11/21/2016,   MRN# [id number] Jason Cain 11/20/1950 Code Status:  Code Status History    This patient does not have a recorded code status. Please follow your organizational policy for patients in this situation.     Hosp day:@LENGTHOFSTAYDAYS @ Referring MD: @ATDPROV @     PCP:      AdmissionWeight: 191 lb (86.6 kg)                 CurrentWeight: 191 lb (86.6 kg) Jason Cain is a 66 y.o. old male seen in consultation for  at the request of Dr. Quillian QuinceBliss.     CHIEF COMPLAINT:  Follow up ASTHMA   HISTORY OF PRESENT ILLNESS   Patient feels 90% better with Flovent and doing really well But he stopped over the summer and his symptoms have returned He restarted flovent and started using his albuterol more frequently Increased cough and wheezing, chest congestion +environmental exposure to burning wood, cold air  No signs of infection at this time  Patient is non smoker, no second hand smoke exposure Patient is very active, he is retired Investment banker, corporatedental lab tech    seen today for problems with sleep Patient  has been having sleep problems for over one year Patient has been having excessive daytime sleepiness Patient has been having extreme fatigue and tiredness, lack of energy  He has no signs of infection at this time No lower ext swelling Office Spiro  04/24/2016 Ratio 52% Fev1 1.29L 40% predicted FVC 2.49L 58% predicted  Offcie    Current Medication:  Current Outpatient Medications:  .  albuterol (PROVENTIL HFA;VENTOLIN HFA) 108 (90 Base) MCG/ACT inhaler, Inhale 2 puffs into the lungs every 4 (four) hours as needed for wheezing or shortness of breath., Disp: 1 Inhaler, Rfl: 3 .  Cholecalciferol (VITAMIN D3) 2000 UNITS capsule, Take 2,000 capsules by mouth daily., Disp: , Rfl:  .  fluticasone (FLOVENT HFA) 110 MCG/ACT inhaler, Inhale 2 puffs into the lungs 2 (two) times daily., Disp: 1 Inhaler, Rfl: 5 .  GLUCAGON  EMERGENCY 1 MG injection, Inject 1 mg into the muscle as needed. Inject if needed for hypoglycemia, Disp: , Rfl: 0 .  insulin aspart (NOVOLOG FLEXPEN) 100 UNIT/ML FlexPen, Inject 30 Units into the skin 3 (three) times daily with meals., Disp: , Rfl:  .  insulin glargine (LANTUS) 100 UNIT/ML injection, Inject 20 Units into the skin at bedtime. , Disp: , Rfl:  .  MAGNESIUM PO, Take 325 mg by mouth daily., Disp: , Rfl:  .  Multiple Vitamins-Minerals (ONE DAILY MINERALS PO), Take 1 oz by mouth daily., Disp: , Rfl:  .  ONE TOUCH ULTRA TEST test strip, 1 each by Other route daily., Disp: , Rfl: 3    ALLERGIES   Codeine     REVIEW OF SYSTEMS   Review of Systems  Constitutional: Positive for malaise/fatigue. Negative for chills, diaphoresis, fever and weight loss.  HENT: Negative for congestion and hearing loss.   Eyes: Negative for blurred vision and double vision.  Respiratory: Positive for cough, shortness of breath and wheezing. Negative for hemoptysis and sputum production.   Cardiovascular: Negative for chest pain, palpitations and orthopnea.  Skin: Negative for rash.  Neurological: Negative for weakness.  All other systems reviewed and are negative.    VS: BP 124/88 (BP Location: Left Arm, Cuff Size: Normal)   Pulse 86   Ht 5\' 9"  (1.753 m)   Wt 191 lb (  86.6 kg)   SpO2 95%   BMI 28.21 kg/m      PHYSICAL EXAM  Physical Exam  Constitutional: He is oriented to person, place, and time. No distress.  HENT:  Head: Atraumatic.  Neck: Neck supple.  Cardiovascular: Normal rate, regular rhythm and normal heart sounds.  No murmur heard. Pulmonary/Chest: Effort normal and breath sounds normal. No stridor. No respiratory distress. He has no wheezes.  Musculoskeletal: Normal range of motion. He exhibits no edema.  Neurological: He is alert and oriented to person, place, and time. No cranial nerve deficit.  Skin: He is not diaphoretic.  Psychiatric: He has a normal mood and affect.      Office Cleda DaubSpiro 11/21/2016 Ratio 52% Fev1 1.29L 40% predicted FVC 2.49L 58% predicted       ASSESSMENT/PLAN   66 yo pleasant white male seen today for signs and symptoms of reactive airways disease likely underlying ASTHMA/COPD in setting of excessive daytime sleepiness and fatigue   #1 underlying reactive airways disease from asthma Patient is to restart his Flovent therapy as prescribed Use albuterol as needed Avoid secondhand smoke and avoid burning with an other environmenmental exposures  #2 excessive fatigue and daytime sleepiness Patient will need sleep study to assess for sleep apnea   Follow up in 3 months to assess resp status and after sleep study obtained    Patient satisfied with Plan of action and management. All questions answered  Lucie LeatherKurian David Kenyonna Micek, M.D.  [facility]  Medical Director [facility] Medical Director [facility] Card[facility] Department

## 2016-11-21 NOTE — Addendum Note (Signed)
Addended by: Meyer CoryAHMAD, Kline Bulthuis R on: 11/21/2016 11:36 AM   Modules accepted: Orders

## 2016-12-04 ENCOUNTER — Ambulatory Visit: Payer: PPO | Admitting: Internal Medicine

## 2016-12-08 DIAGNOSIS — R252 Cramp and spasm: Secondary | ICD-10-CM | POA: Diagnosis not present

## 2016-12-08 DIAGNOSIS — R253 Fasciculation: Secondary | ICD-10-CM | POA: Diagnosis not present

## 2016-12-08 DIAGNOSIS — M6289 Other specified disorders of muscle: Secondary | ICD-10-CM | POA: Diagnosis not present

## 2016-12-08 DIAGNOSIS — M5417 Radiculopathy, lumbosacral region: Secondary | ICD-10-CM | POA: Diagnosis not present

## 2016-12-11 ENCOUNTER — Encounter: Payer: Self-pay | Admitting: Internal Medicine

## 2016-12-11 DIAGNOSIS — G4719 Other hypersomnia: Secondary | ICD-10-CM

## 2016-12-15 DIAGNOSIS — G4733 Obstructive sleep apnea (adult) (pediatric): Secondary | ICD-10-CM | POA: Diagnosis not present

## 2016-12-21 ENCOUNTER — Telehealth: Payer: Self-pay | Admitting: *Deleted

## 2016-12-21 DIAGNOSIS — G4733 Obstructive sleep apnea (adult) (pediatric): Secondary | ICD-10-CM

## 2016-12-21 DIAGNOSIS — E109 Type 1 diabetes mellitus without complications: Secondary | ICD-10-CM | POA: Diagnosis not present

## 2016-12-21 NOTE — Telephone Encounter (Signed)
Patient is aware of sleep study results. Orders have been placed. Nothing further needed.

## 2016-12-28 DIAGNOSIS — E109 Type 1 diabetes mellitus without complications: Secondary | ICD-10-CM | POA: Diagnosis not present

## 2016-12-30 ENCOUNTER — Other Ambulatory Visit: Payer: Self-pay | Admitting: Internal Medicine

## 2017-01-10 ENCOUNTER — Telehealth: Payer: Self-pay | Admitting: Internal Medicine

## 2017-01-10 NOTE — Telephone Encounter (Signed)
Returned call and advised appt needs to be after 02/11/17. She will call back to schedule f/u as Feb 2019 schedule is not open yet. Nothing further needed.

## 2017-01-10 NOTE — Telephone Encounter (Signed)
Patient is going to get  CPAP 01/11/17 and wife wants to know if she should have him wait on appt for fu until after he has used machine for a while (30  Or 90 days )   Please advise

## 2017-01-11 DIAGNOSIS — G4733 Obstructive sleep apnea (adult) (pediatric): Secondary | ICD-10-CM | POA: Diagnosis not present

## 2017-01-26 DIAGNOSIS — E119 Type 2 diabetes mellitus without complications: Secondary | ICD-10-CM | POA: Diagnosis not present

## 2017-02-11 DIAGNOSIS — G4733 Obstructive sleep apnea (adult) (pediatric): Secondary | ICD-10-CM | POA: Diagnosis not present

## 2017-02-15 ENCOUNTER — Ambulatory Visit: Payer: PPO | Admitting: Internal Medicine

## 2017-02-15 ENCOUNTER — Encounter: Payer: Self-pay | Admitting: Internal Medicine

## 2017-02-15 VITALS — BP 150/90 | HR 80 | Ht 69.0 in | Wt 190.0 lb

## 2017-02-15 DIAGNOSIS — J4520 Mild intermittent asthma, uncomplicated: Secondary | ICD-10-CM | POA: Diagnosis not present

## 2017-02-15 DIAGNOSIS — G4733 Obstructive sleep apnea (adult) (pediatric): Secondary | ICD-10-CM

## 2017-02-15 NOTE — Progress Notes (Signed)
[facility]      Date: 02/15/2017,   MRN# [id number] Jason Cain 01/03/1951 Code Status:  Code Status History    This patient does not have a recorded code status. Please follow your organizational policy for patients in this situation.       AdmissionWeight: 190 lb (86.2 kg)                 CurrentWeight: 190 lb (86.2 kg) Jason Cain is a 67 y.o. old male seen in consultation for  at the request of Dr. Quillian QuinceBliss.     CHIEF COMPLAINT:  Follow up ASTHMA, Dx of OSA Dx of OSA  HISTORY OF PRESENT ILLNESS   Patient dx with OSA AHO was 27, 219 apnea/hypopnea  Compliance report 100% compliance 30days >4 hrs 100 Compliance  AHI down to 1.5 Fatigue and tiredness has resolved  Patient feels much better with Flovent and doing really well +environmental exposure to burning wood, cold air  No signs of infection at this time  Patient is non smoker, no second hand smoke exposure Patient is very active, he is retired Investment banker, corporatedental lab tech  He has no signs of infection at this time No lower ext swelling Office Spiro  04/24/2016 Ratio 52% Fev1 1.29L 40% predicted FVC 2.49L 58% predicted  Offcie    Current Medication:  Current Outpatient Medications:  .  albuterol (PROVENTIL HFA;VENTOLIN HFA) 108 (90 Base) MCG/ACT inhaler, Inhale 2 puffs into the lungs every 4 (four) hours as needed for wheezing or shortness of breath., Disp: 1 Inhaler, Rfl: 3 .  Cholecalciferol (VITAMIN D3) 2000 UNITS capsule, Take 2,000 capsules by mouth daily., Disp: , Rfl:  .  FLOVENT HFA 110 MCG/ACT inhaler, INHALE 2 PUFFS BY MOUTH TWICE DAILY, Disp: 12 g, Rfl: 5 .  GLUCAGON EMERGENCY 1 MG injection, Inject 1 mg into the muscle as needed. Inject if needed for hypoglycemia, Disp: , Rfl: 0 .  insulin aspart (NOVOLOG FLEXPEN) 100 UNIT/ML FlexPen, Inject 30 Units into the skin 3 (three) times daily with meals., Disp: , Rfl:  .  insulin glargine (LANTUS) 100 UNIT/ML injection, Inject 20 Units  into the skin at bedtime. , Disp: , Rfl:  .  MAGNESIUM PO, Take 325 mg by mouth daily., Disp: , Rfl:  .  Multiple Vitamins-Minerals (ONE DAILY MINERALS PO), Take 1 oz by mouth daily., Disp: , Rfl:  .  ONE TOUCH ULTRA TEST test strip, 1 each by Other route daily., Disp: , Rfl: 3    ALLERGIES   Codeine     REVIEW OF SYSTEMS   Review of Systems  Constitutional: Negative for chills, diaphoresis, fever, malaise/fatigue and weight loss.  HENT: Negative for congestion and hearing loss.   Eyes: Negative for blurred vision and double vision.  Respiratory: Negative for cough, hemoptysis, sputum production, shortness of breath and wheezing.   Cardiovascular: Negative for chest pain, palpitations and orthopnea.  Skin: Negative for rash.  Neurological: Negative for weakness.  All other systems reviewed and are negative.    VS: BP (!) 150/90 (BP Location: Left Arm, Cuff Size: Normal)   Pulse 80   Ht 5\' 9"  (1.753 m)   Wt 190 lb (86.2 kg)   SpO2 96%   BMI 28.06 kg/m      PHYSICAL EXAM  Physical Exam  Constitutional: He is oriented to person, place, and time. No distress.  HENT:  Head: Atraumatic.  Neck: Neck supple.  Cardiovascular: Normal rate, regular rhythm and normal heart  sounds.  No murmur heard. Pulmonary/Chest: Effort normal and breath sounds normal. No stridor. No respiratory distress. He has no wheezes.  Musculoskeletal: Normal range of motion. He exhibits no edema.  Neurological: He is alert and oriented to person, place, and time. No cranial nerve deficit.  Skin: He is not diaphoretic.  Psychiatric: He has a normal mood and affect.     Office Cleda Daub 02/15/2017 Ratio 52% Fev1 1.29L 40% predicted FVC 2.49L 58% predicted       ASSESSMENT/PLAN   67 yo pleasant white male seen today for signs and symptoms of reactive airways disease underlying ASTHMA/COPD in setting of excessive daytime sleepiness and fatigue with dx of severe OSA   #1 underlying reactive  airways disease from asthma -continue Flovent therapy as prescribed Use albuterol as needed Avoid secondhand smoke and avoid burning with an other environmenmental exposures  #2 excessive fatigue and daytime sleepiness Dx of OSA Continue auto CPAP 5-15 cm h20 Patient benefiting and using properly and compliant    Follow up in 6 months   Patient satisfied with Plan of action and management. All questions answered  Lucie Leather, M.D.  [facility]  Medical Director [facility] Medical Director [facility] Cardio-Pulmonary Department

## 2017-02-15 NOTE — Patient Instructions (Signed)
Continue therapy for sleep apnea Continue flovent as prescribed

## 2017-03-11 DIAGNOSIS — G4733 Obstructive sleep apnea (adult) (pediatric): Secondary | ICD-10-CM | POA: Diagnosis not present

## 2017-04-11 DIAGNOSIS — G4733 Obstructive sleep apnea (adult) (pediatric): Secondary | ICD-10-CM | POA: Diagnosis not present

## 2017-05-11 DIAGNOSIS — G4733 Obstructive sleep apnea (adult) (pediatric): Secondary | ICD-10-CM | POA: Diagnosis not present

## 2017-05-29 ENCOUNTER — Other Ambulatory Visit: Payer: Self-pay | Admitting: Internal Medicine

## 2017-06-07 DIAGNOSIS — G4733 Obstructive sleep apnea (adult) (pediatric): Secondary | ICD-10-CM | POA: Diagnosis not present

## 2017-06-07 DIAGNOSIS — Z8739 Personal history of other diseases of the musculoskeletal system and connective tissue: Secondary | ICD-10-CM | POA: Diagnosis not present

## 2017-06-07 DIAGNOSIS — R5382 Chronic fatigue, unspecified: Secondary | ICD-10-CM | POA: Diagnosis not present

## 2017-06-07 DIAGNOSIS — Z9989 Dependence on other enabling machines and devices: Secondary | ICD-10-CM | POA: Diagnosis not present

## 2017-06-07 DIAGNOSIS — R253 Fasciculation: Secondary | ICD-10-CM | POA: Diagnosis not present

## 2017-06-07 DIAGNOSIS — R252 Cramp and spasm: Secondary | ICD-10-CM | POA: Diagnosis not present

## 2017-06-07 DIAGNOSIS — L508 Other urticaria: Secondary | ICD-10-CM | POA: Diagnosis not present

## 2017-06-11 DIAGNOSIS — G4733 Obstructive sleep apnea (adult) (pediatric): Secondary | ICD-10-CM | POA: Diagnosis not present

## 2017-06-13 ENCOUNTER — Other Ambulatory Visit: Payer: Self-pay | Admitting: Internal Medicine

## 2017-06-26 DIAGNOSIS — E109 Type 1 diabetes mellitus without complications: Secondary | ICD-10-CM | POA: Diagnosis not present

## 2017-06-26 DIAGNOSIS — R5382 Chronic fatigue, unspecified: Secondary | ICD-10-CM | POA: Diagnosis not present

## 2017-06-26 DIAGNOSIS — L509 Urticaria, unspecified: Secondary | ICD-10-CM | POA: Diagnosis not present

## 2017-06-28 ENCOUNTER — Other Ambulatory Visit: Payer: Self-pay | Admitting: Internal Medicine

## 2017-07-03 DIAGNOSIS — E109 Type 1 diabetes mellitus without complications: Secondary | ICD-10-CM | POA: Diagnosis not present

## 2017-07-10 DIAGNOSIS — L509 Urticaria, unspecified: Secondary | ICD-10-CM | POA: Diagnosis not present

## 2017-07-11 DIAGNOSIS — G4733 Obstructive sleep apnea (adult) (pediatric): Secondary | ICD-10-CM | POA: Diagnosis not present

## 2017-07-14 ENCOUNTER — Other Ambulatory Visit: Payer: Self-pay | Admitting: Internal Medicine

## 2017-07-16 ENCOUNTER — Other Ambulatory Visit: Payer: Self-pay | Admitting: Internal Medicine

## 2017-07-30 DIAGNOSIS — E109 Type 1 diabetes mellitus without complications: Secondary | ICD-10-CM | POA: Diagnosis not present

## 2017-07-30 DIAGNOSIS — M791 Myalgia, unspecified site: Secondary | ICD-10-CM | POA: Diagnosis not present

## 2017-07-30 DIAGNOSIS — Z6827 Body mass index (BMI) 27.0-27.9, adult: Secondary | ICD-10-CM | POA: Diagnosis not present

## 2017-07-30 DIAGNOSIS — R5383 Other fatigue: Secondary | ICD-10-CM | POA: Diagnosis not present

## 2017-07-30 DIAGNOSIS — L509 Urticaria, unspecified: Secondary | ICD-10-CM | POA: Diagnosis not present

## 2017-08-02 ENCOUNTER — Other Ambulatory Visit: Payer: Self-pay | Admitting: Internal Medicine

## 2017-08-11 DIAGNOSIS — G4733 Obstructive sleep apnea (adult) (pediatric): Secondary | ICD-10-CM | POA: Diagnosis not present

## 2017-08-14 DIAGNOSIS — R5383 Other fatigue: Secondary | ICD-10-CM | POA: Diagnosis not present

## 2017-08-14 DIAGNOSIS — L509 Urticaria, unspecified: Secondary | ICD-10-CM | POA: Diagnosis not present

## 2017-08-14 DIAGNOSIS — M791 Myalgia, unspecified site: Secondary | ICD-10-CM | POA: Diagnosis not present

## 2017-08-14 DIAGNOSIS — M7918 Myalgia, other site: Secondary | ICD-10-CM | POA: Diagnosis not present

## 2017-08-14 DIAGNOSIS — Z6828 Body mass index (BMI) 28.0-28.9, adult: Secondary | ICD-10-CM | POA: Diagnosis not present

## 2017-08-15 DIAGNOSIS — Z1212 Encounter for screening for malignant neoplasm of rectum: Secondary | ICD-10-CM | POA: Diagnosis not present

## 2017-08-15 DIAGNOSIS — Z1211 Encounter for screening for malignant neoplasm of colon: Secondary | ICD-10-CM | POA: Diagnosis not present

## 2017-08-20 ENCOUNTER — Other Ambulatory Visit: Payer: Self-pay

## 2017-08-20 MED ORDER — FLUTICASONE PROPIONATE HFA 110 MCG/ACT IN AERO: 2.0000 | INHALATION_SPRAY | Freq: Two times a day (BID) | RESPIRATORY_TRACT | 0 refills | Status: DC

## 2017-08-20 NOTE — Telephone Encounter (Signed)
*  STAT* If patient is at the pharmacy, call can be transferred to refill team.   1. Which medications need to be refilled? (please list name of each medication and dose if known) Flovent  2. Which pharmacy/location (including street and city if local pharmacy) is medication to be sent to?[facility] [address]  3. Do they need a 30 day or 90 day supply? 90

## 2017-08-23 ENCOUNTER — Ambulatory Visit: Payer: PPO | Admitting: Internal Medicine

## 2017-08-23 DIAGNOSIS — G4733 Obstructive sleep apnea (adult) (pediatric): Secondary | ICD-10-CM | POA: Diagnosis not present

## 2017-08-27 ENCOUNTER — Encounter: Payer: Self-pay | Admitting: Internal Medicine

## 2017-08-27 ENCOUNTER — Ambulatory Visit: Payer: PPO | Admitting: Internal Medicine

## 2017-08-27 ENCOUNTER — Telehealth: Payer: Self-pay | Admitting: Internal Medicine

## 2017-08-27 VITALS — BP 110/78 | HR 67 | Resp 16 | Ht 69.0 in | Wt 188.0 lb

## 2017-08-27 DIAGNOSIS — G4733 Obstructive sleep apnea (adult) (pediatric): Secondary | ICD-10-CM

## 2017-08-27 DIAGNOSIS — L503 Dermatographic urticaria: Secondary | ICD-10-CM | POA: Diagnosis not present

## 2017-08-27 DIAGNOSIS — J4520 Mild intermittent asthma, uncomplicated: Secondary | ICD-10-CM | POA: Diagnosis not present

## 2017-08-27 NOTE — Progress Notes (Signed)
[facility]      Date: 08/27/2017,   MRN# [id number] Jason FellsMark C Natter 10/24/1950 Code Status:  Code Status History    This patient does not have a recorded code status. Please follow your organizational policy for patients in this situation.       AdmissionWeight: 188 lb (85.3 kg)                 CurrentWeight: 188 lb (85.3 kg) Jason Cain is a 67 y.o. old male seen in consultation for  at the request of Dr. Quillian QuinceBliss.     CHIEF COMPLAINT:  Follow up ASTHMA, Dx of OSA Dx of OSA  HISTORY OF PRESENT ILLNESS   Patient dx with OSA AHI was 27, 219 apnea/hypopnea  Compliance report 83% compliance 30 days >4 hrs 50% Compliance Patient has rash all over bidy, described as hives Dx of Dermatographia is bothering him and now is around his neck-this is affecting his compliance  AHI down to 1.5 Fatigue and tiredness has resolved  Patient feels much better with Flovent and doing really well +environmental exposure to burning wood, cold air  No signs of infection at this time  Patient is non smoker, no second hand smoke exposure Patient is very active, he is retired Investment banker, corporatedental lab tech  He has no signs of infection at this time No lower ext swelling  Patient has increased chest congestion last several weeks Has not been cleaning his CPAP machine well and that's causing his current symptoms are resolving with time and cleaning of machine   Office Spiro  04/24/2016 Ratio 52% Fev1 1.29L 40% predicted FVC 2.49L 58% predicted  Offcie    Current Medication:  Current Outpatient Medications:  .  albuterol (PROVENTIL HFA) 108 (90 Base) MCG/ACT inhaler, Inhale 2 puffs into the lungs every 6 (six) hours as needed for wheezing or shortness of breath., Disp: 6.7 g, Rfl: 3 .  Cholecalciferol (VITAMIN D3) 2000 UNITS capsule, Take 2,000 capsules by mouth daily., Disp: , Rfl:  .  fluticasone (FLOVENT HFA) 110 MCG/ACT inhaler, Inhale 2 puffs into the lungs 2 (two) times  daily., Disp: 12 g, Rfl: 0 .  GLUCAGON EMERGENCY 1 MG injection, Inject 1 mg into the muscle as needed. Inject if needed for hypoglycemia, Disp: , Rfl: 0 .  insulin aspart (NOVOLOG FLEXPEN) 100 UNIT/ML FlexPen, Inject 30 Units into the skin 3 (three) times daily with meals., Disp: , Rfl:  .  insulin glargine (LANTUS) 100 UNIT/ML injection, Inject 20 Units into the skin at bedtime. , Disp: , Rfl:  .  ONE TOUCH ULTRA TEST test strip, 1 each by Other route daily., Disp: , Rfl: 3 MEDICATIONS: I have reviewed all medications and confirmed regimen as documented  Active Ambulatory Problems    Diagnosis Date Noted  . Bilateral hand pain 07/08/2015  . Chronic fatigue, unspecified 07/08/2015  . Chronic hand pain, right 10/07/2015  . Ganglion cyst of joint of finger of right hand 10/07/2015   Resolved Ambulatory Problems    Diagnosis Date Noted  . No Resolved Ambulatory Problems   Past Medical History:  Diagnosis Date  . Diabetes (HCC)   . Pneumonia    Past Surgical History:  Procedure Laterality Date  . APPENDECTOMY    . BACK SURGERY  1983   Family History  Problem Relation Age of Onset  . Asthma Mother       ALLERGIES   Codeine     REVIEW OF SYSTEMS   Review  of Systems  Constitutional: Negative for chills, diaphoresis, fever, malaise/fatigue and weight loss.  HENT: Negative for congestion and hearing loss.   Eyes: Negative for blurred vision and double vision.  Respiratory: Negative for cough, hemoptysis, sputum production, shortness of breath and wheezing.   Cardiovascular: Negative for chest pain, palpitations and orthopnea.  Skin: Negative for rash.  Neurological: Negative for weakness.  All other systems reviewed and are negative. +rash   VS: BP 110/78 (BP Location: Left Arm, Cuff Size: Large)   Pulse 67   Resp 16   Ht 5\' 9"  (1.753 m)   Wt 188 lb (85.3 kg)   SpO2 95%   BMI 27.76 kg/m      PHYSICAL EXAM  Physical Exam  Constitutional: He is oriented to  person, place, and time. No distress.  HENT:  Head: Atraumatic.  Neck: Neck supple.  Cardiovascular: Normal rate, regular rhythm and normal heart sounds.  No murmur heard. Pulmonary/Chest: Effort normal and breath sounds normal. No stridor. No respiratory distress. He has no wheezes.  Musculoskeletal: Normal range of motion. He exhibits no edema.  Neurological: He is alert and oriented to person, place, and time. No cranial nerve deficit.  Skin: Rash noted. He is not diaphoretic. There is erythema.  Psychiatric: He has a normal mood and affect.  +hives around neck   Office Spiro Ratio 52% Fev1 1.29L 40% predicted FVC 2.49L 58% predicted       ASSESSMENT/PLAN   67 yo pleasant white male seen today for signs and symptoms of reactive airways disease underlying ASTHMA/COPD in setting of excessive daytime sleepiness and fatigue with dx of severe OSA with new dx of dermatographia with hives-this has impeded his use of CPAP machine. He has inflammation around neck area at this time   #1 underlying reactive airways disease from asthma -continue Flovent therapy as prescribed Use albuterol as needed Avoid secondhand smoke and avoid burning with an other environmenmental exposures  #2 excessive fatigue and daytime sleepiness Dx of OSA Continue auto CPAP 5-15 cm h20 Patient benefiting and using  #3 recommend dermatology referral  Follow up in 6 months   Patient satisfied with Plan of action and management. All questions answered  Lucie LeatherKurian David Liya Strollo, M.D.  [facility]  Medical Director [facility] Medical Director [facility]

## 2017-08-27 NOTE — Telephone Encounter (Signed)
Pt wife is calling back with the name of where he got his CPAP. [facility] [phone]

## 2017-08-27 NOTE — Patient Instructions (Signed)
Continue inhalers as prescribed Continue CPAP as prescribed and advised to wear more than 4 hrs per day  Recommend Dermatologist

## 2017-08-27 NOTE — Telephone Encounter (Signed)
noted 

## 2017-09-05 DIAGNOSIS — G4733 Obstructive sleep apnea (adult) (pediatric): Secondary | ICD-10-CM | POA: Diagnosis not present

## 2017-09-11 DIAGNOSIS — G4733 Obstructive sleep apnea (adult) (pediatric): Secondary | ICD-10-CM | POA: Diagnosis not present

## 2017-10-11 DIAGNOSIS — G4733 Obstructive sleep apnea (adult) (pediatric): Secondary | ICD-10-CM | POA: Diagnosis not present

## 2017-11-09 DIAGNOSIS — J3089 Other allergic rhinitis: Secondary | ICD-10-CM | POA: Diagnosis not present

## 2017-11-09 DIAGNOSIS — J4530 Mild persistent asthma, uncomplicated: Secondary | ICD-10-CM | POA: Diagnosis not present

## 2017-11-09 DIAGNOSIS — L503 Dermatographic urticaria: Secondary | ICD-10-CM | POA: Diagnosis not present

## 2017-11-11 DIAGNOSIS — G4733 Obstructive sleep apnea (adult) (pediatric): Secondary | ICD-10-CM | POA: Diagnosis not present

## 2017-12-11 DIAGNOSIS — G4733 Obstructive sleep apnea (adult) (pediatric): Secondary | ICD-10-CM | POA: Diagnosis not present

## 2017-12-22 ENCOUNTER — Other Ambulatory Visit: Payer: Self-pay | Admitting: Internal Medicine

## 2017-12-31 DIAGNOSIS — E109 Type 1 diabetes mellitus without complications: Secondary | ICD-10-CM | POA: Diagnosis not present

## 2018-01-07 DIAGNOSIS — E785 Hyperlipidemia, unspecified: Secondary | ICD-10-CM | POA: Diagnosis not present

## 2018-01-07 DIAGNOSIS — E109 Type 1 diabetes mellitus without complications: Secondary | ICD-10-CM | POA: Diagnosis not present

## 2018-01-10 DIAGNOSIS — L509 Urticaria, unspecified: Secondary | ICD-10-CM | POA: Diagnosis not present

## 2018-01-11 DIAGNOSIS — G4733 Obstructive sleep apnea (adult) (pediatric): Secondary | ICD-10-CM | POA: Diagnosis not present

## 2018-02-11 DIAGNOSIS — Z9989 Dependence on other enabling machines and devices: Secondary | ICD-10-CM | POA: Diagnosis not present

## 2018-02-11 DIAGNOSIS — L508 Other urticaria: Secondary | ICD-10-CM | POA: Diagnosis not present

## 2018-02-11 DIAGNOSIS — R5382 Chronic fatigue, unspecified: Secondary | ICD-10-CM | POA: Diagnosis not present

## 2018-02-11 DIAGNOSIS — R253 Fasciculation: Secondary | ICD-10-CM | POA: Diagnosis not present

## 2018-02-11 DIAGNOSIS — M6289 Other specified disorders of muscle: Secondary | ICD-10-CM | POA: Diagnosis not present

## 2018-02-11 DIAGNOSIS — G4733 Obstructive sleep apnea (adult) (pediatric): Secondary | ICD-10-CM | POA: Diagnosis not present

## 2018-02-11 DIAGNOSIS — R252 Cramp and spasm: Secondary | ICD-10-CM | POA: Diagnosis not present

## 2018-02-26 ENCOUNTER — Other Ambulatory Visit: Payer: Self-pay | Admitting: Neurology

## 2018-02-26 ENCOUNTER — Other Ambulatory Visit (HOSPITAL_COMMUNITY): Payer: Self-pay | Admitting: Neurology

## 2018-02-26 DIAGNOSIS — R253 Fasciculation: Secondary | ICD-10-CM

## 2018-03-06 ENCOUNTER — Ambulatory Visit
Admission: RE | Admit: 2018-03-06 | Discharge: 2018-03-06 | Disposition: A | Discharge status: Home / Self Care | Payer: PPO | Source: Ambulatory Visit | Attending: Neurology | Admitting: Neurology

## 2018-03-06 DIAGNOSIS — M4802 Spinal stenosis, cervical region: Secondary | ICD-10-CM | POA: Diagnosis not present

## 2018-03-06 DIAGNOSIS — R253 Fasciculation: Secondary | ICD-10-CM | POA: Diagnosis not present

## 2018-03-06 LAB — POCT I-STAT CREATININE: CREATININE: 1 mg/dL (ref 0.61–1.24)

## 2018-03-06 MED ORDER — GADOBUTROL 1 MMOL/ML IV SOLN
8.0000 mL | Freq: Once | INTRAVENOUS | Status: AC | PRN
  Administered 2018-03-06: 8 mL via INTRAVENOUS

## 2018-03-08 DIAGNOSIS — G4733 Obstructive sleep apnea (adult) (pediatric): Secondary | ICD-10-CM | POA: Diagnosis not present

## 2018-04-12 DIAGNOSIS — G4733 Obstructive sleep apnea (adult) (pediatric): Secondary | ICD-10-CM | POA: Diagnosis not present

## 2018-08-10 ENCOUNTER — Other Ambulatory Visit: Payer: Self-pay | Admitting: Internal Medicine

## 2018-10-02 ENCOUNTER — Ambulatory Visit (INDEPENDENT_AMBULATORY_CARE_PROVIDER_SITE_OTHER): Payer: PPO | Admitting: Internal Medicine

## 2018-10-02 ENCOUNTER — Encounter: Payer: Self-pay | Admitting: Internal Medicine

## 2018-10-02 DIAGNOSIS — J449 Chronic obstructive pulmonary disease, unspecified: Secondary | ICD-10-CM

## 2018-10-02 MED ORDER — ALBUTEROL SULFATE HFA 108 (90 BASE) MCG/ACT IN AERS: 2.0000 | INHALATION_SPRAY | Freq: Four times a day (QID) | RESPIRATORY_TRACT | 3 refills | Status: DC | PRN

## 2018-10-02 MED ORDER — FLOVENT HFA 110 MCG/ACT IN AERO: 2.0000 | INHALATION_SPRAY | Freq: Two times a day (BID) | RESPIRATORY_TRACT | 0 refills | Status: DC

## 2018-10-02 NOTE — Patient Instructions (Signed)
MEDICATION ADJUSTMENTS/LABS AND TESTS ORDERED: Continue inhalers as prescribed Continue CPAP as prescribed

## 2018-10-02 NOTE — Progress Notes (Signed)
[facility] Pulmonary Medicine Consultation      Date: 10/02/2018,   MRN# [id number] Jason Corbello [place] 02-Jul-1950   I connected with the patient by telephone enabled telemedicine visit and verified that I am speaking with the correct person using two identifiers.    I discussed the limitations, risks, security and privacy concerns of performing an evaluation and management service by telemedicine and the availability of in-person appointments. I also discussed with the patient that there Rasberry be a patient responsible charge related to this service. The patient expressed understanding and agreed to proceed.  PATIENT AGREES AND CONFIRMS -YES   Other persons participating in the visit and their role in the encounter: Patient, nursing  This visit type was conducted due to national recommendations for restrictions regarding the COVID-19 Pandemic (e.g. social distancing).  This format is felt to be most appropriate for this patient at this time.  All issues noted in this document were discussed and addressed.     Sleep Study Patient dx with OSA AHI was 27, 219 apnea/hypopnea  Office Cleda Daub  04/24/2016 Ratio 52% Fev1 1.29L 40% predicted FVC 2.49L 58% predicted  CHIEF COMPLAINT:  Follow up ASTHMA/COPD Follow up OSA Dx of OSA  HISTORY OF PRESENT ILLNESS   Asthma and COPD seems to be under control at this time Patient takes Flovent inhaler Rinse his mouth out  No signs of exacerbation at this time Patient uses CPAP machine for sleep apnea Patient uses and benefits from CPAP therapy  Previous AHI is down to 1.5  +environmental exposure to burning wood, cold air  Is non-smoker No secondhand smoke exposure Very active Retired Investment banker, corporate    No of ASTHMA/COPD exacerbation at this time No evidence of heart failure at this time No evidence or signs of infection at this time No respiratory distress No fevers, chills, nausea, vomiting, diarrhea No evidence of lower extremity edema No  evidence hemoptysis   Offcie    Current Medication:  Current Outpatient Medications:  .  albuterol (PROVENTIL HFA) 108 (90 Base) MCG/ACT inhaler, Inhale 2 puffs into the lungs every 6 (six) hours as needed for wheezing or shortness of breath., Disp: 6.7 g, Rfl: 3 .  Cholecalciferol (VITAMIN D3) 2000 UNITS capsule, Take 2,000 capsules by mouth daily., Disp: , Rfl:  .  FLOVENT HFA 110 MCG/ACT inhaler, INHALE 2 PUFFS INTO THE LUNGS TWICE DAILY, Disp: 12 g, Rfl: 0 .  GLUCAGON EMERGENCY 1 MG injection, Inject 1 mg into the muscle as needed. Inject if needed for hypoglycemia, Disp: , Rfl: 0 .  insulin aspart (NOVOLOG FLEXPEN) 100 UNIT/ML FlexPen, Inject 30 Units into the skin 3 (three) times daily with meals., Disp: , Rfl:  .  insulin glargine (LANTUS) 100 UNIT/ML injection, Inject 20 Units into the skin at bedtime. , Disp: , Rfl:  .  ONE TOUCH ULTRA TEST test strip, 1 each by Other route daily., Disp: , Rfl: 3 MEDICATIONS: I have reviewed all medications and confirmed regimen as documented  Active Ambulatory Problems    Diagnosis Date Noted  . Bilateral hand pain 07/08/2015  . Chronic fatigue, unspecified 07/08/2015  . Chronic hand pain, right 10/07/2015  . Ganglion cyst of joint of finger of right hand 10/07/2015   Resolved Ambulatory Problems    Diagnosis Date Noted  . No Resolved Ambulatory Problems   Past Medical History:  Diagnosis Date  . Diabetes (HCC)   . Pneumonia    Past Surgical History:  Procedure Laterality Date  . APPENDECTOMY    .  BACK SURGERY  1983   Family History  Problem Relation Age of Onset  . Asthma Mother       ALLERGIES   Codeine     REVIEW OF SYSTEMS   Review of Systems  Constitutional: Negative for chills, diaphoresis, fever, malaise/fatigue and weight loss.  HENT: Negative for congestion and hearing loss.   Eyes: Negative for blurred vision and double vision.  Respiratory: Negative for cough, hemoptysis, sputum production, shortness of  breath and wheezing.   Cardiovascular: Negative for chest pain, palpitations and orthopnea.  Skin: Negative for rash.  Neurological: Negative for weakness.  All other systems reviewed and are negative.       Office Arlyce Harman Ratio 52% Fev1 1.29L 40% predicted FVC 2.49L 58% predicted      Asthma COPD  Asthma COPD Intermittent reactive airways disease Continue Flovent as prescribed Albuterol as needed Avoid secondhand smoke and avoid burning with any environmental exposures  Excessive fatigue and daytime sleepiness with a diagnosis of sleep apnea Well-controlled at this time with CPAP 5 to 15 cm of water pressure Patient uses and benefits from CPAP therapy  COVID-19 EDUCATION: The signs and symptoms of COVID-19 were discussed with the patient and how to seek care for testing.  The importance of social distancing was discussed today. Hand Washing Techniques and avoid touching face was advised.  MEDICATION ADJUSTMENTS/LABS AND TESTS ORDERED: Continue inhalers as prescribed Continue CPAP as prescribed   CURRENT MEDICATIONS REVIEWED AT LENGTH WITH PATIENT TODAY  Patient satisfied with Plan of action and management. All questions answered  Follow up in 1 year  Total Time spent 26 mins  Abdulloh Ullom Patricia Pesa, M.D.  [facility]  Medical Director [place] Director [facility] Cardio-Pulmonary Department

## 2018-10-07 ENCOUNTER — Telehealth: Payer: Self-pay | Admitting: Internal Medicine

## 2018-10-07 DIAGNOSIS — J449 Chronic obstructive pulmonary disease, unspecified: Secondary | ICD-10-CM

## 2018-10-07 MED ORDER — FLOVENT HFA 110 MCG/ACT IN AERO: 2.0000 | INHALATION_SPRAY | Freq: Two times a day (BID) | RESPIRATORY_TRACT | 0 refills | Status: DC

## 2018-10-07 NOTE — Telephone Encounter (Signed)
Rx has been sent to preferred pharmacy. Nothing further is needed.  

## 2018-10-09 DIAGNOSIS — E109 Type 1 diabetes mellitus without complications: Secondary | ICD-10-CM | POA: Diagnosis not present

## 2018-10-09 DIAGNOSIS — E785 Hyperlipidemia, unspecified: Secondary | ICD-10-CM | POA: Diagnosis not present

## 2018-10-11 DIAGNOSIS — E109 Type 1 diabetes mellitus without complications: Secondary | ICD-10-CM | POA: Diagnosis not present

## 2019-01-20 ENCOUNTER — Other Ambulatory Visit: Payer: Self-pay

## 2019-01-20 DIAGNOSIS — J449 Chronic obstructive pulmonary disease, unspecified: Secondary | ICD-10-CM

## 2019-01-20 MED ORDER — FLOVENT HFA 110 MCG/ACT IN AERO: 2.0000 | INHALATION_SPRAY | Freq: Two times a day (BID) | RESPIRATORY_TRACT | 2 refills | Status: DC

## 2019-03-13 ENCOUNTER — Ambulatory Visit: Payer: PPO

## 2019-03-13 ENCOUNTER — Other Ambulatory Visit: Payer: Self-pay

## 2019-03-13 ENCOUNTER — Ambulatory Visit: Payer: PPO | Attending: Internal Medicine

## 2019-03-13 DIAGNOSIS — Z23 Encounter for immunization: Secondary | ICD-10-CM | POA: Insufficient documentation

## 2019-03-13 NOTE — Progress Notes (Signed)
   Covid-19 Vaccination Clinic  Name:  Jason Cain    MRN: [id number] DOB: [birth year]  03/13/2019  Mr. [name] was observed post Covid-19 immunization for 15 minutes without incident. He was provided with Vaccine Information Sheet and instruction to access the V-Safe system.   Mr. [name] was instructed to call 911 with any severe reactions post vaccine: Marland Kitchen Difficulty breathing  . Swelling of face and throat  . A fast heartbeat  . A bad rash all over body  . Dizziness and weakness   Immunizations Administered    Name Date Dose VIS Date Route   Pfizer COVID-19 Vaccine 03/13/2019 10:56 AM 0.3 mL 12/20/2018 Intramuscular   Manufacturer: [employer], [employer]   Lot: [id number]   NDC: [address]-4

## 2019-04-03 ENCOUNTER — Ambulatory Visit: Payer: PPO | Attending: Internal Medicine

## 2019-04-03 DIAGNOSIS — Z23 Encounter for immunization: Secondary | ICD-10-CM

## 2019-04-03 NOTE — Progress Notes (Signed)
   Covid-19 Vaccination Clinic  Name:  Jason Cain    MRN: [id number] DOB: 20-Dec-1950  04/03/2019  [name] was observed post Covid-19 immunization for 15 minutes without incident. He was provided with Vaccine Information Sheet and instruction to access the V-Safe system.   Mr. Vessel was instructed to call 911 with any severe reactions post vaccine: Marland Kitchen Difficulty breathing  . Swelling of face and throat  . A fast heartbeat  . A bad rash all over body  . Dizziness and weakness   Immunizations Administered    Name Date Dose VIS Date Route   Pfizer COVID-19 Vaccine 04/03/2019 11:35 AM 0.3 mL 12/20/2018 Intramuscular   Manufacturer: [employer], [employer]   Lot: [id number]   NDC: [address]-9

## 2019-04-16 ENCOUNTER — Other Ambulatory Visit: Payer: Self-pay

## 2019-04-16 DIAGNOSIS — J449 Chronic obstructive pulmonary disease, unspecified: Secondary | ICD-10-CM

## 2019-04-16 MED ORDER — FLOVENT HFA 110 MCG/ACT IN AERO: 2.0000 | INHALATION_SPRAY | Freq: Two times a day (BID) | RESPIRATORY_TRACT | 2 refills | Status: DC

## 2019-05-21 DIAGNOSIS — R253 Fasciculation: Secondary | ICD-10-CM | POA: Diagnosis not present

## 2019-05-27 DIAGNOSIS — E785 Hyperlipidemia, unspecified: Secondary | ICD-10-CM | POA: Diagnosis not present

## 2019-05-27 DIAGNOSIS — E109 Type 1 diabetes mellitus without complications: Secondary | ICD-10-CM | POA: Diagnosis not present

## 2019-06-03 DIAGNOSIS — E109 Type 1 diabetes mellitus without complications: Secondary | ICD-10-CM | POA: Diagnosis not present

## 2019-07-25 ENCOUNTER — Other Ambulatory Visit: Payer: Self-pay | Admitting: Internal Medicine

## 2019-07-25 DIAGNOSIS — J449 Chronic obstructive pulmonary disease, unspecified: Secondary | ICD-10-CM

## 2019-10-01 ENCOUNTER — Other Ambulatory Visit: Payer: Self-pay | Admitting: Internal Medicine

## 2019-10-01 DIAGNOSIS — J449 Chronic obstructive pulmonary disease, unspecified: Secondary | ICD-10-CM

## 2019-11-26 ENCOUNTER — Telehealth: Payer: Self-pay | Admitting: Internal Medicine

## 2019-11-26 NOTE — Telephone Encounter (Signed)
Thanks fine

## 2019-11-26 NOTE — Telephone Encounter (Signed)
Spoke to patient's spouse, Derba(DPR). Nino Glow is requesting for 12/16/2019 visit to be by phone due to health concerns.  Beth, please advise. Thanks.

## 2019-11-26 NOTE — Telephone Encounter (Signed)
Patient's spouse, Debra(DPR) is aware of below message and voiced her understanding.  appt note has been updated to reflect phone visit. Nothing further needed.

## 2019-12-02 DIAGNOSIS — E109 Type 1 diabetes mellitus without complications: Secondary | ICD-10-CM | POA: Diagnosis not present

## 2019-12-09 DIAGNOSIS — E109 Type 1 diabetes mellitus without complications: Secondary | ICD-10-CM | POA: Diagnosis not present

## 2019-12-09 DIAGNOSIS — R030 Elevated blood-pressure reading, without diagnosis of hypertension: Secondary | ICD-10-CM | POA: Diagnosis not present

## 2019-12-09 DIAGNOSIS — E785 Hyperlipidemia, unspecified: Secondary | ICD-10-CM | POA: Diagnosis not present

## 2019-12-15 ENCOUNTER — Telehealth: Payer: Self-pay | Admitting: Primary Care

## 2019-12-15 DIAGNOSIS — Z0000 Encounter for general adult medical examination without abnormal findings: Secondary | ICD-10-CM | POA: Diagnosis not present

## 2019-12-15 DIAGNOSIS — J45909 Unspecified asthma, uncomplicated: Secondary | ICD-10-CM | POA: Diagnosis not present

## 2019-12-15 DIAGNOSIS — M79606 Pain in leg, unspecified: Secondary | ICD-10-CM | POA: Diagnosis not present

## 2019-12-15 DIAGNOSIS — E139 Other specified diabetes mellitus without complications: Secondary | ICD-10-CM | POA: Diagnosis not present

## 2019-12-15 DIAGNOSIS — Z6828 Body mass index (BMI) 28.0-28.9, adult: Secondary | ICD-10-CM | POA: Diagnosis not present

## 2019-12-15 DIAGNOSIS — M67431 Ganglion, right wrist: Secondary | ICD-10-CM | POA: Diagnosis not present

## 2019-12-15 DIAGNOSIS — Z794 Long term (current) use of insulin: Secondary | ICD-10-CM | POA: Diagnosis not present

## 2019-12-15 NOTE — Telephone Encounter (Signed)
Noted  

## 2019-12-15 NOTE — Telephone Encounter (Signed)
12/15/2019  Attempted to contact the patient.  Left voicemail.  Left detailed voicemail regarding patient's concerns about only 15-minute long appointment.  Explained over voicemail that we cannot guarantee that an appointment will only be 15 minutes long given the nature of our work and if patients require longer amounts of time before then the appointment Orlich run longer than expected.  We cannot guarantee this.  We will CC Ames Dura, NP as well as Lajoyce Lauber, CMA as they will be working with the patient in the [place] office tomorrow.  Explained over the voicemail that if patient cannot keep the appointment without this guarantee that would recommend rescheduling follow-up.  Nothing further needed.  Elisha Headland, FNP

## 2019-12-16 ENCOUNTER — Ambulatory Visit: Payer: PPO | Admitting: Primary Care

## 2019-12-16 ENCOUNTER — Ambulatory Visit (INDEPENDENT_AMBULATORY_CARE_PROVIDER_SITE_OTHER): Payer: PPO | Admitting: Primary Care

## 2019-12-16 ENCOUNTER — Other Ambulatory Visit: Payer: Self-pay

## 2019-12-16 DIAGNOSIS — J449 Chronic obstructive pulmonary disease, unspecified: Secondary | ICD-10-CM | POA: Diagnosis not present

## 2019-12-16 MED ORDER — ALBUTEROL SULFATE HFA 108 (90 BASE) MCG/ACT IN AERS: 2.0000 | INHALATION_SPRAY | Freq: Four times a day (QID) | RESPIRATORY_TRACT | 3 refills | Status: DC | PRN

## 2019-12-16 MED ORDER — FLOVENT HFA 110 MCG/ACT IN AERO
2.0000 | INHALATION_SPRAY | Freq: Two times a day (BID) | RESPIRATORY_TRACT | 11 refills | Status: DC
Start: 2019-12-16 — End: 2021-01-11

## 2019-12-16 NOTE — Progress Notes (Signed)
Virtual Visit via Telephone Note  I connected with Jason Cain on 12/16/19 at  3:00 PM EST by telephone and verified that I am speaking with the correct person using two identifiers.  Location: Patient: Home Provider: Office   I discussed the limitations, risks, security and privacy concerns of performing an evaluation and management service by telephone and the availability of in person appointments. I also discussed with the patient that there Mortell be a patient responsible charge related to this service. The patient expressed understanding and agreed to proceed.   History of Present Illness: 69 year old male, never smoked.  Past medical history sniffing for asthma and OSA.  Patient of Dr. Belia Heman, last seen in office on 10/02/2018. Maintained on Flovent HFA and prn albuterol.     12/16/2019 Patient contacted today for 1 year follow-up asthma. Breathing has been well controlled. He has no acute complaints. No hospitalizations or exacerbatins. Compliant with Flovent inhaler. He has not needed to use his Albuterol rescue inhaler. He is not wearing CPAP. He reports that he is sleeping well without it and is no longer snoring. He does not have any residual daytime fatigue. He has been doing more yardwork outside. Weight 180 lbs. He has had both covid vaccines. Declined influenza vaccine.    Observations/Objective:  Sleep study  AHI was 27, 219 apnea/hypopnea.  Office Cleda Daub  04/24/2016 Ratio 52% Fev1 1.29L 40% predicted FVC 2.49L 58% predicted  Assessment and Plan:  Asthma/COPD: - Well controlled on present treatment - No recent exacerbations. No SABA use.  - Continue Flovent 2 puffs twice daily  OSA: - Not currently using CPAP, sleeping well without it - Denies snoring or daytime fatigue - Working on weight loss  - Do not drive if experiencing excessive daytime fatigue or somnolence    Follow Up Instructions:   - 6 months with Dr. Belia Heman   I discussed the assessment and  treatment plan with the patient. The patient was provided an opportunity to ask questions and all were answered. The patient agreed with the plan and demonstrated an understanding of the instructions.   The patient was advised to call back or seek an in-person evaluation if the symptoms worsen or if the condition fails to improve as anticipated.  I provided 15 minutes of non-face-to-face time during this encounter.   Glenford Bayley, NP

## 2019-12-17 DIAGNOSIS — H2513 Age-related nuclear cataract, bilateral: Secondary | ICD-10-CM | POA: Diagnosis not present

## 2019-12-17 DIAGNOSIS — H40003 Preglaucoma, unspecified, bilateral: Secondary | ICD-10-CM | POA: Diagnosis not present

## 2019-12-24 NOTE — Patient Instructions (Signed)
No changes Continue Flovent twice daily Continue to work on maintaining weight loss efforts If you notice sleep disruption, increased snoring, apnea or daytime fatigue notify office

## 2020-05-20 DIAGNOSIS — R253 Fasciculation: Secondary | ICD-10-CM | POA: Diagnosis not present

## 2020-05-20 DIAGNOSIS — E109 Type 1 diabetes mellitus without complications: Secondary | ICD-10-CM | POA: Diagnosis not present

## 2020-06-02 DIAGNOSIS — R5383 Other fatigue: Secondary | ICD-10-CM | POA: Diagnosis not present

## 2020-06-02 DIAGNOSIS — E109 Type 1 diabetes mellitus without complications: Secondary | ICD-10-CM | POA: Diagnosis not present

## 2020-06-09 DIAGNOSIS — E785 Hyperlipidemia, unspecified: Secondary | ICD-10-CM | POA: Diagnosis not present

## 2020-06-09 DIAGNOSIS — E109 Type 1 diabetes mellitus without complications: Secondary | ICD-10-CM | POA: Diagnosis not present

## 2020-07-20 ENCOUNTER — Encounter: Payer: Self-pay | Admitting: Internal Medicine

## 2020-07-20 ENCOUNTER — Ambulatory Visit (INDEPENDENT_AMBULATORY_CARE_PROVIDER_SITE_OTHER): Payer: HMO | Admitting: Internal Medicine

## 2020-07-20 ENCOUNTER — Other Ambulatory Visit: Payer: Self-pay

## 2020-07-20 VITALS — BP 120/68 | HR 87 | Temp 97.7°F | Ht 69.0 in | Wt 185.0 lb

## 2020-07-20 DIAGNOSIS — J449 Chronic obstructive pulmonary disease, unspecified: Secondary | ICD-10-CM

## 2020-07-20 NOTE — Patient Instructions (Addendum)
Change FLOVENT to 1 PUFF twice a day  Albuterol as needed  Avoid dust and allergens

## 2020-07-20 NOTE — Progress Notes (Signed)
[facility] Pulmonary Medicine Consultation      Date: 07/20/2020,   MRN# [id number] Jason Cain 11/17/1950   Sleep Study Patient dx with OSA AHI was 27, 219 apnea/hypopnea  Office Cleda Daub  04/24/2016 Ratio 52% Fev1 1.29L 40% predicted FVC 2.49L 58% predicted  CHIEF COMPLAINT:   Follow-up asthma/COPD Follow-up OSA Dx of OSA  HISTORY OF PRESENT ILLNESS    Asthma COPD seems to be controlled at this time  Patient takes Flovent inhaler 2 puffs twice daily  No signs of exacerbation over the last several years Plan is to decrease Flovent to 1 puff twice daily Rinse mouth after every use  No exacerbation at this time No evidence of heart failure at this time No evidence or signs of infection at this time No respiratory distress No fevers, chills, nausea, vomiting, diarrhea No evidence of lower extremity edema No evidence hemoptysis  Patient no longer uses CPAP for sleep apnea   +environmental exposure to burning wood, cold air  Is non-smoker No secondhand smoke exposure Very active Retired IT sales professional    Current Medication:  Current Outpatient Medications:    albuterol (PROVENTIL HFA) 108 (90 Base) MCG/ACT inhaler, Inhale 2 puffs into the lungs every 6 (six) hours as needed for wheezing or shortness of breath., Disp: 6.7 g, Rfl: 3   fluticasone (FLOVENT HFA) 110 MCG/ACT inhaler, Inhale 2 puffs into the lungs 2 (two) times daily., Disp: 12 g, Rfl: 11   GLUCAGON EMERGENCY 1 MG injection, Inject 1 mg into the muscle as needed. Inject if needed for hypoglycemia, Disp: , Rfl: 0   insulin aspart (NOVOLOG) 100 UNIT/ML FlexPen, Inject 30 Units into the skin 3 (three) times daily with meals. Patient reported once daily, Disp: , Rfl:    insulin glargine (LANTUS) 100 UNIT/ML injection, Inject 30 Units into the skin at bedtime. Patient reported 20 units, Disp: , Rfl:    loratadine (CLARITIN) 10 MG tablet, Take 10 mg by mouth daily., Disp: , Rfl:     Cholecalciferol (VITAMIN D3) 2000 UNITS capsule, Take 2,000 capsules by mouth daily., Disp: , Rfl:  MEDICATIONS: I have reviewed all medications and confirmed regimen as documented  Active Ambulatory Problems    Diagnosis Date Noted   Bilateral hand pain 07/08/2015   Chronic fatigue, unspecified 07/08/2015   Chronic hand pain, right 10/07/2015   Ganglion cyst of joint of finger of right hand 10/07/2015   Resolved Ambulatory Problems    Diagnosis Date Noted   No Resolved Ambulatory Problems   Past Medical History:  Diagnosis Date   Diabetes (HCC)    Pneumonia    Past Surgical History:  Procedure Laterality Date   APPENDECTOMY     BACK SURGERY  1983   Family History  Problem Relation Age of Onset   Asthma Mother       ALLERGIES   Codeine     REVIEW OF SYSTEMS   Review of Systems  Constitutional:  Negative for chills, diaphoresis, fever, malaise/fatigue and weight loss.  HENT:  Negative for congestion and hearing loss.   Eyes:  Negative for blurred vision and double vision.  Respiratory:  Negative for cough, hemoptysis, sputum production, shortness of breath and wheezing.   Cardiovascular:  Negative for chest pain, palpitations and orthopnea.  Skin:  Negative for rash.  Neurological:  Negative for weakness.  All other systems reviewed and are negative.  BP 120/68 (BP Location: Left Arm, Cuff Size: Normal)   Pulse 87  Temp 97.7 F (36.5 C) (Temporal)   Ht 5\' 9"  (1.753 m)   Wt 185 lb (83.9 kg)   SpO2 96%   BMI 27.32 kg/m    Physical Examination:   General Appearance: No distress  EYES PERRLA, EOM intact.   NECK Supple, No JVD Pulmonary: normal breath sounds, No wheezing.  CardiovascularNormal S1,S2.  No m/r/g.   Abdomen: Benign, Soft, non-tender. Skin:   warm, no rashes, no ecchymosis  Extremities: normal, no cyanosis, clubbing. Neuro:without focal findings,  speech normal  PSYCHIATRIC: Mood, affect within normal limits.   ALL OTHER ROS ARE  NEGATIVE     Office Spiro Ratio 52% Fev1 1.29L 40% predicted FVC 2.49L 58% predicted      Asthma COPD Assessment and plan  70 year old pleasant white male with asthma/COPD with underlying OSA  Asthma/COPD Intermittent reactive airways disease Patient advised to continue Flovent however will try 1 puff twice daily Albuterol as needed Avoid secondhand smoke exposure avoid burning with any environmental exposures  Patient states he feels well overall Therefore he has discontinued his CPAP machine I have a advised patient to obtain retesting however patient does not want to retest at this time   MEDICATION ADJUSTMENTS/LABS AND TESTS ORDERED: Continue inhalers as prescribed Avoid allergy Avoid secondhand smoke    CURRENT MEDICATIONS REVIEWED AT LENGTH WITH PATIENT TODAY  Patient satisfied with Plan of action and management. All questions answered  Follow-up in 1 year  Total time spent 22 minutes  Josaphine Shimamoto 09-[birth year]1992, M.D.  [facility]  Medical Director [facility] Medical Director [facility] Cardio-Pulmonary Department

## 2020-07-21 ENCOUNTER — Ambulatory Visit: Payer: HMO | Admitting: Internal Medicine

## 2020-12-15 IMAGING — MR MR LUMBAR SPINE WO/W CM
6 of 7 series · 30 of 48 positions shown · IV contrast (agent unspecified)
Comparison: Lumbar MRI 05/29/2016

CLINICAL DATA: Muscular fasciculation. Back surgery 1980s.
Bilateral leg cramping

EXAM:
MRI LUMBAR SPINE WITHOUT AND WITH CONTRAST
TECHNIQUE: Multiplanar and multiecho pulse sequences of the lumbar spine were
obtained without and with intravenous contrast.
CONTRAST:  8 mL Gadovist IV

[Series 5: T2 · sagittal · 4.0mm · 0.81mm/px · 4 of 18 slices shown (1 of 2)]
[im 1/18]
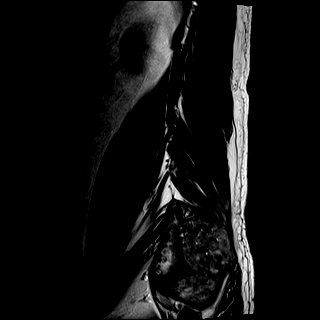
[im 6/18]
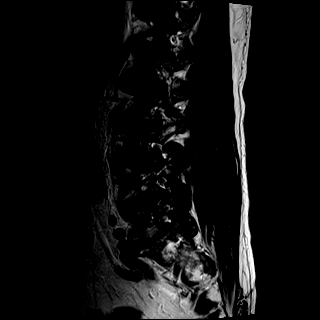
[im 12/18]
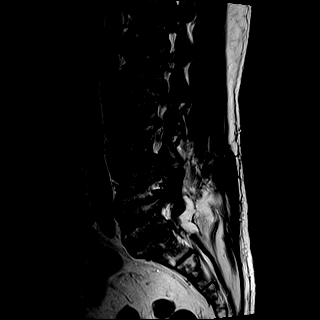
[im 18/18]
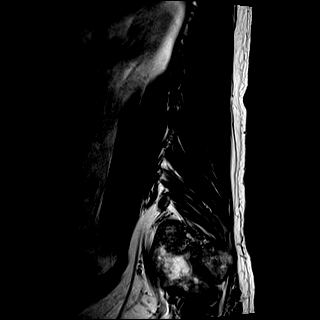

[Series 6: T1 · sagittal · 4.0mm · 0.81mm/px · 4 of 18 slices shown (1 of 2)]
[im 1/18]
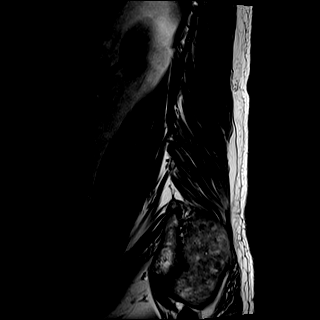
[im 6/18]
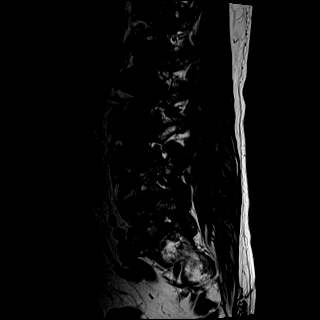
[im 12/18]
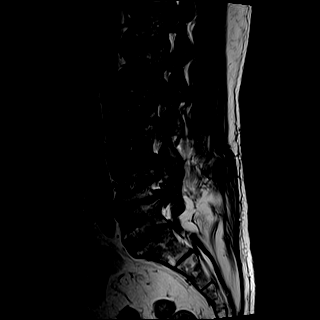
[im 18/18]
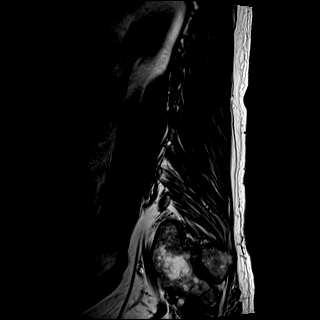

[Series 7: STIR · sagittal · 4.0mm · 0.41mm/px · 1 of 18 slices shown]
[im 1/18]
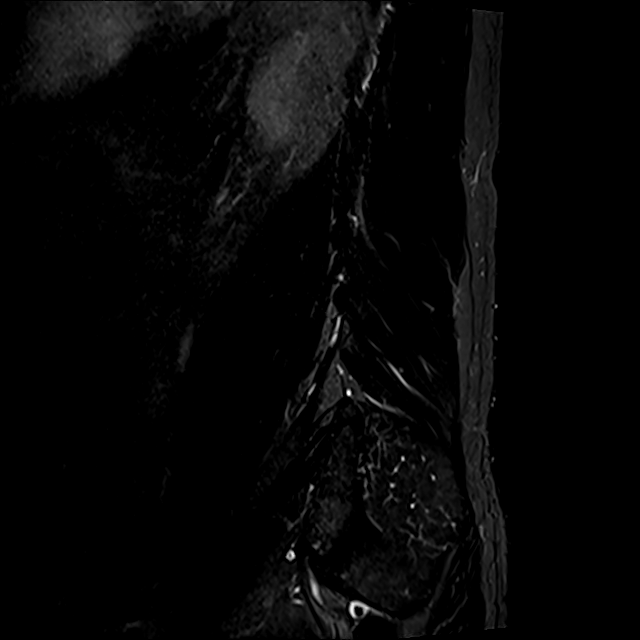

[Series 8: T2 · axial · 4.0mm · 0.78mm/px · z∈[-64,+132]mm · 8 of 36 slices shown (2 of 2)]
[im 1/36]
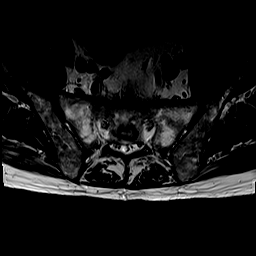
[im 4/36]
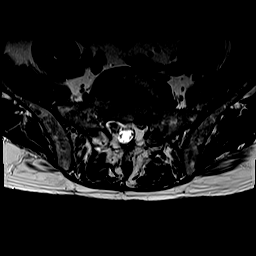
[im 12/36]
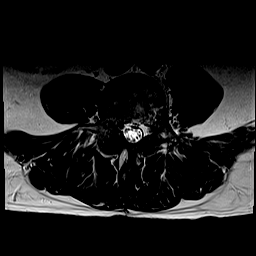
[im 16/36]
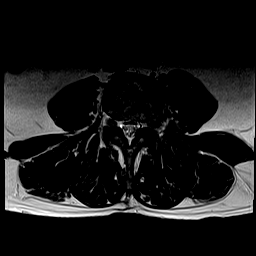
[im 20/36]
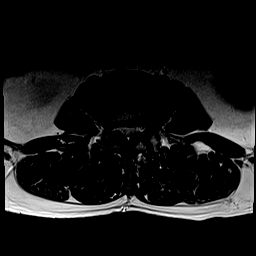
[im 24/36]
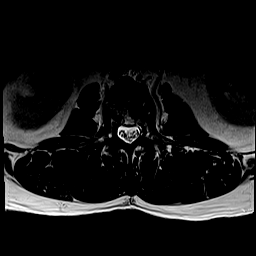
[im 32/36]
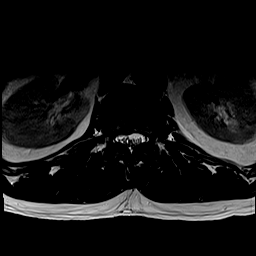
[im 36/36]
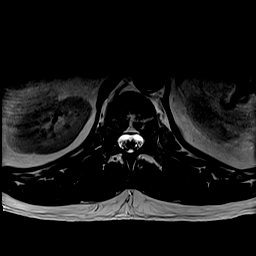

[Series 9: T1 · axial · 4.0mm · 0.39mm/px · z∈[-64,+132]mm · 8 of 36 slices shown (2 of 2)]
[im 1/36]
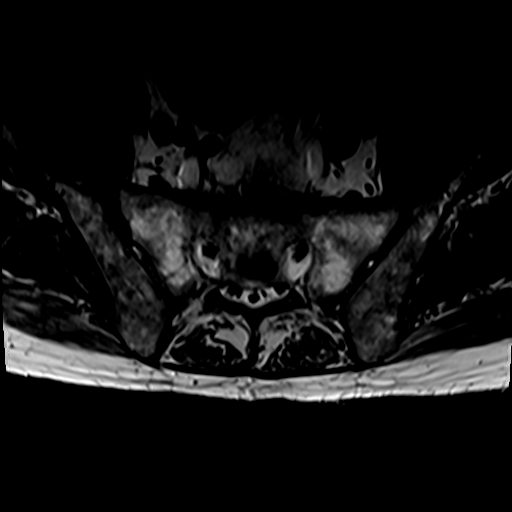
[im 4/36]
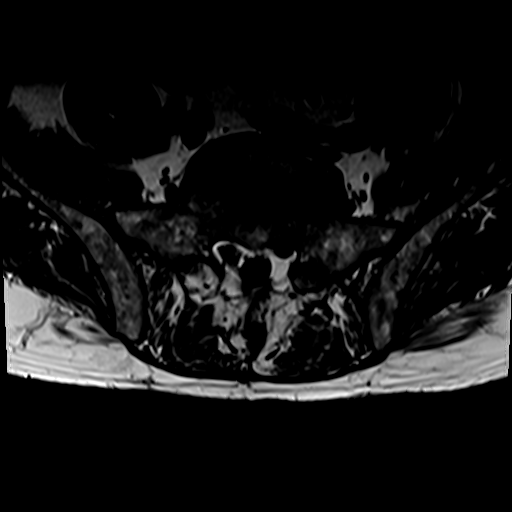
[im 12/36]
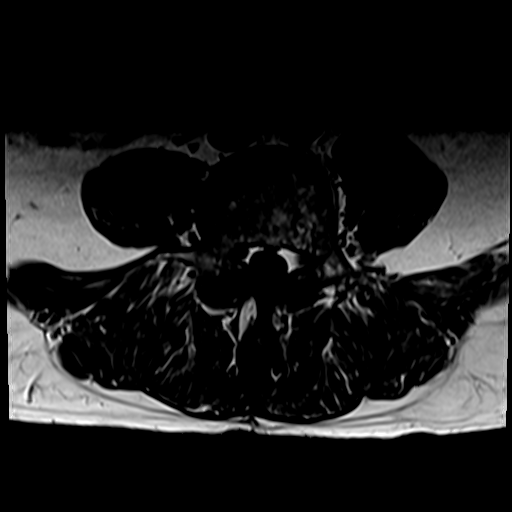
[im 16/36]
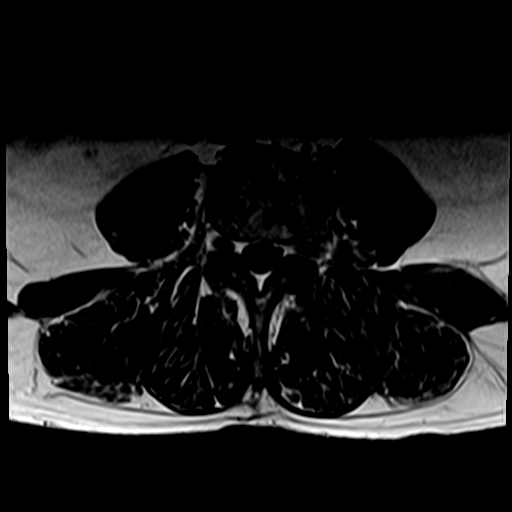
[im 20/36]
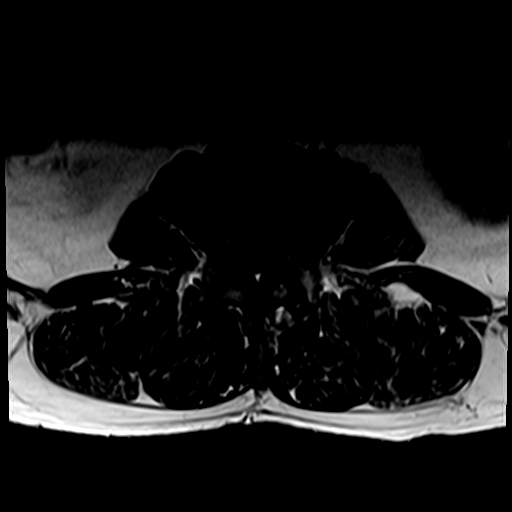
[im 24/36]
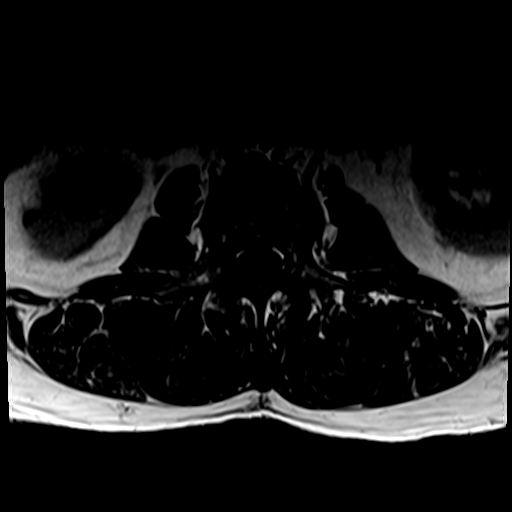
[im 32/36]
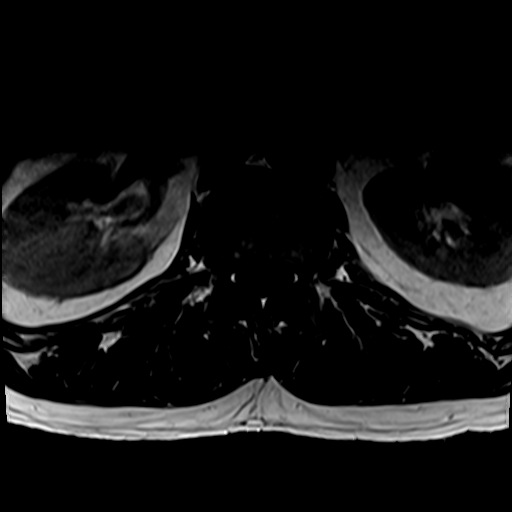
[im 36/36]
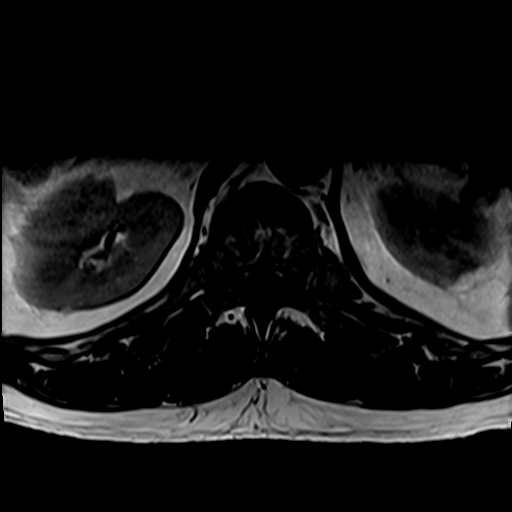

[Series 10: T1 fat-sat post-contrast · sagittal · 4.0mm · 0.81mm/px · 5 of 18 slices shown]
[im 1/18]
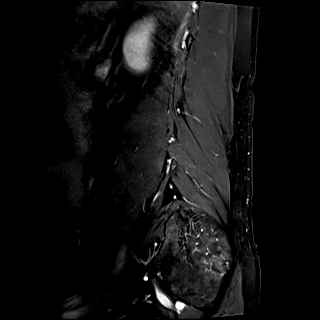
[im 5/18]
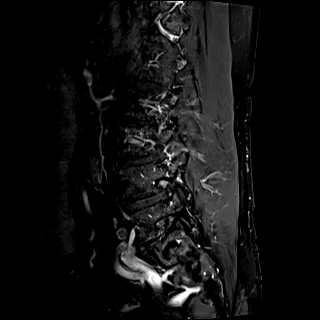
[im 9/18]
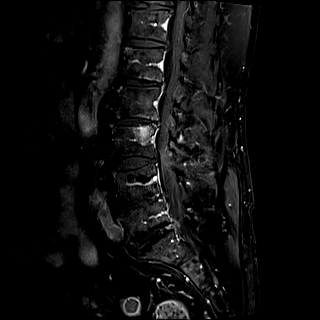
[im 13/18]
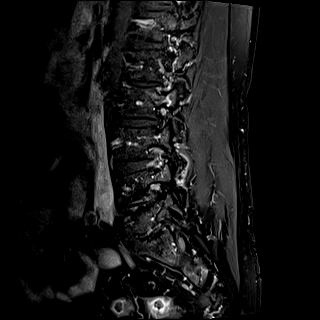
[im 18/18]
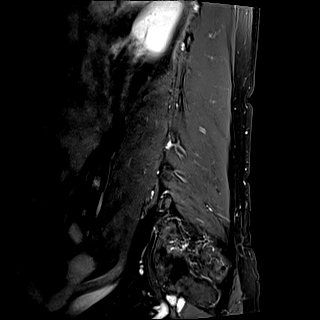

[30 of 48 positions shown; findings below may reference images not displayed]

FINDINGS: Segmentation:  Normal

Alignment:  Normal

Vertebrae: Heterogeneous bone marrow with multiple fatty deposits.
Negative for fracture or mass. Schmorl's node with edema and
enhancement in the bone marrow at L1-2 and L2-3.

Conus medullaris and cauda equina: Conus extends to the L1 level.
Conus and cauda equina appear normal.

Paraspinal and other soft tissues: Negative for paraspinous mass or
edema.

Disc levels:

T12-L1: Progressive central and right-sided disc protrusion. Mild
subarticular stenosis on the right.

L1-2: Disc degeneration with Schmorl's node. Diffuse disc bulging
and spurring with mild facet degeneration. Mild spinal stenosis

L2-3: Moderate to severe spinal stenosis appears unchanged. Diffuse
disc bulging with endplate spurring. Moderate subarticular stenosis
on the right.. Interval improvement in the small left-sided disc
protrusion with improvement in subarticular stenosis on the left.

L3-4: Moderate to severe spinal stenosis unchanged. Severe facet
degeneration. Diffuse disc bulging. Broad-based extraforaminal disc
protrusion on the left has progressed in the interval and could
affect the left L3 nerve root.

L4-5: Bilateral laminotomy. Advanced disc degeneration with endplate
spurring. Moderate subarticular stenosis bilaterally

L5-S1: Bilateral decompressive laminotomy. Moderate subarticular
stenosis bilaterally appears unchanged.
IMPRESSION: 1. Progression of central and right-sided disc protrusion at T12-L1
with mild subarticular stenosis on the right
2. Mild spinal stenosis L1-2
3. Moderate to severe spinal stenosis at L2-3. Improvement in small
left-sided disc protrusion at L2-3
4. Moderate to severe spinal stenosis L3-4 unchanged. Progression of
broad-based extraforaminal disc protrusion on the left
5. Decompressive laminotomies at L4-5 and L5-S1 with disc
degeneration and spurring bilaterally.

## 2020-12-24 DIAGNOSIS — Z1331 Encounter for screening for depression: Secondary | ICD-10-CM | POA: Diagnosis not present

## 2020-12-24 DIAGNOSIS — E109 Type 1 diabetes mellitus without complications: Secondary | ICD-10-CM | POA: Diagnosis not present

## 2020-12-24 DIAGNOSIS — E1129 Type 2 diabetes mellitus with other diabetic kidney complication: Secondary | ICD-10-CM | POA: Diagnosis not present

## 2020-12-24 DIAGNOSIS — E139 Other specified diabetes mellitus without complications: Secondary | ICD-10-CM | POA: Diagnosis not present

## 2020-12-24 DIAGNOSIS — Z1389 Encounter for screening for other disorder: Secondary | ICD-10-CM | POA: Diagnosis not present

## 2020-12-24 DIAGNOSIS — Z6828 Body mass index (BMI) 28.0-28.9, adult: Secondary | ICD-10-CM | POA: Diagnosis not present

## 2020-12-24 DIAGNOSIS — G4730 Sleep apnea, unspecified: Secondary | ICD-10-CM | POA: Diagnosis not present

## 2020-12-24 DIAGNOSIS — Z0000 Encounter for general adult medical examination without abnormal findings: Secondary | ICD-10-CM | POA: Diagnosis not present

## 2020-12-24 DIAGNOSIS — R635 Abnormal weight gain: Secondary | ICD-10-CM | POA: Diagnosis not present

## 2020-12-24 DIAGNOSIS — Z79899 Other long term (current) drug therapy: Secondary | ICD-10-CM | POA: Diagnosis not present

## 2020-12-29 DIAGNOSIS — E109 Type 1 diabetes mellitus without complications: Secondary | ICD-10-CM | POA: Diagnosis not present

## 2021-01-11 ENCOUNTER — Other Ambulatory Visit: Payer: Self-pay

## 2021-01-11 DIAGNOSIS — J449 Chronic obstructive pulmonary disease, unspecified: Secondary | ICD-10-CM

## 2021-01-11 MED ORDER — FLUTICASONE PROPIONATE HFA 110 MCG/ACT IN AERO: 2.0000 | INHALATION_SPRAY | Freq: Two times a day (BID) | RESPIRATORY_TRACT | 5 refills | Status: DC

## 2021-01-11 NOTE — Telephone Encounter (Signed)
Flovent sent to [facility] as requested by pharmacy

## 2021-01-16 ENCOUNTER — Encounter: Payer: Self-pay | Admitting: Family Medicine

## 2021-01-17 ENCOUNTER — Telehealth: Payer: Self-pay

## 2021-01-17 NOTE — Telephone Encounter (Signed)
CALLED PATIENT NO ANSWER LEFT VOICEMAIL FOR A CALL BACK ? ?

## 2021-01-18 ENCOUNTER — Other Ambulatory Visit: Payer: Self-pay

## 2021-01-18 DIAGNOSIS — Z1211 Encounter for screening for malignant neoplasm of colon: Secondary | ICD-10-CM

## 2021-01-18 MED ORDER — PEG 3350-KCL-NA BICARB-NACL 420 G PO SOLR: 4000.0000 mL | Freq: Once | ORAL | 0 refills | Status: AC

## 2021-01-18 NOTE — Progress Notes (Signed)
Gastroenterology Pre-Procedure Review  Request Date: 02/18/2021 Requesting Physician: Dr. Tobi Bastos  PATIENT REVIEW QUESTIONS: The patient responded to the following health history questions as indicated:    1. Are you having any GI issues? no 2. Do you have a personal history of Polyps? no 3. Do you have a family history of Colon Cancer or Polyps? no 4. Diabetes Mellitus? yes (type 1) 5. Joint replacements in the past 12 months?no 6. Major health problems in the past 3 months?no 7. Any artificial heart valves, MVP, or defibrillator?no    MEDICATIONS & ALLERGIES:    Patient reports the following regarding taking any anticoagulation/antiplatelet therapy:   Plavix, Coumadin, Eliquis, Xarelto, Lovenox, Pradaxa, Brilinta, or Effient? no Aspirin? no  Patient confirms/reports the following medications:  Current Outpatient Medications  Medication Sig Dispense Refill   albuterol (PROVENTIL HFA) 108 (90 Base) MCG/ACT inhaler Inhale 2 puffs into the lungs every 6 (six) hours as needed for wheezing or shortness of breath. 6.7 g 3   Cholecalciferol (VITAMIN D3) 2000 UNITS capsule Take 2,000 capsules by mouth daily.     fluticasone (FLOVENT HFA) 110 MCG/ACT inhaler Inhale 2 puffs into the lungs 2 (two) times daily. 12 g 5   GLUCAGON EMERGENCY 1 MG injection Inject 1 mg into the muscle as needed. Inject if needed for hypoglycemia  0   insulin aspart (NOVOLOG) 100 UNIT/ML FlexPen Inject 30 Units into the skin 3 (three) times daily with meals. Patient reported once daily     insulin glargine (LANTUS) 100 UNIT/ML injection Inject 30 Units into the skin at bedtime. Patient reported 20 units     loratadine (CLARITIN) 10 MG tablet Take 10 mg by mouth daily.     No current facility-administered medications for this visit.    Patient confirms/reports the following allergies:  Allergies  Allergen Reactions   Codeine Nausea And Vomiting    No orders of the defined types were placed in this  encounter.   AUTHORIZATION INFORMATION Primary Insurance: 1D#: Group #:  Secondary Insurance: 1D#: Group #:  SCHEDULE INFORMATION: Date:  02/18/2021 Time: Location: [facility]

## 2021-01-31 ENCOUNTER — Telehealth: Payer: Self-pay

## 2021-01-31 NOTE — Telephone Encounter (Signed)
Patient called to cancel procedure and does not want to reschedule at this time

## 2021-02-18 ENCOUNTER — Ambulatory Visit: Admit: 2021-02-18 | Payer: HMO | Admitting: Gastroenterology

## 2021-02-18 SURGERY — COLONOSCOPY WITH PROPOFOL
Anesthesia: General

## 2021-03-08 ENCOUNTER — Other Ambulatory Visit (HOSPITAL_COMMUNITY): Payer: Self-pay

## 2021-03-08 ENCOUNTER — Telehealth: Payer: Self-pay | Admitting: Internal Medicine

## 2021-03-08 DIAGNOSIS — J449 Chronic obstructive pulmonary disease, unspecified: Secondary | ICD-10-CM

## 2021-03-08 MED ORDER — FLUTICASONE PROPIONATE HFA 110 MCG/ACT IN AERO: 2.0000 | INHALATION_SPRAY | Freq: Two times a day (BID) | RESPIRATORY_TRACT | 5 refills | Status: DC

## 2021-03-08 NOTE — Telephone Encounter (Signed)
Our PA team did not initiate a PA for Flovent HFA. However, test bill was submitted and returned a copay of $45. No PA is needed.

## 2021-03-08 NOTE — Telephone Encounter (Signed)
Patient is aware of below message and voiced his understanding.  Rx sent to preferred pharmacy. Nothing further needed.

## 2021-03-08 NOTE — Telephone Encounter (Signed)
PA team, please advise. Thanks ?

## 2021-03-10 ENCOUNTER — Other Ambulatory Visit (HOSPITAL_COMMUNITY): Payer: Self-pay

## 2021-08-09 ENCOUNTER — Ambulatory Visit: Payer: HMO | Admitting: Adult Health

## 2021-09-13 ENCOUNTER — Encounter: Payer: Self-pay | Admitting: Internal Medicine

## 2021-09-13 ENCOUNTER — Ambulatory Visit: Payer: HMO | Admitting: Internal Medicine

## 2021-09-13 VITALS — BP 132/70 | HR 83 | Temp 98.1°F | Ht 65.0 in | Wt 184.8 lb

## 2021-09-13 DIAGNOSIS — J449 Chronic obstructive pulmonary disease, unspecified: Secondary | ICD-10-CM

## 2021-09-13 DIAGNOSIS — G4733 Obstructive sleep apnea (adult) (pediatric): Secondary | ICD-10-CM | POA: Diagnosis not present

## 2021-09-13 DIAGNOSIS — J4520 Mild intermittent asthma, uncomplicated: Secondary | ICD-10-CM | POA: Diagnosis not present

## 2021-09-13 MED ORDER — FLUTICASONE PROPIONATE HFA 110 MCG/ACT IN AERO: 2.0000 | INHALATION_SPRAY | Freq: Two times a day (BID) | RESPIRATORY_TRACT | 10 refills | Status: DC

## 2021-09-13 NOTE — Patient Instructions (Signed)
Continue inhalers as prescribed Avoid allergy Avoid secondhand smoke

## 2021-09-13 NOTE — Progress Notes (Signed)
[facility] [place] Pulmonary Medicine Consultation      Date: 09/13/2021,   MRN# [id number] Jason Cain 08-17-1950   Sleep Study Patient dx with OSA AHI was 27, 219 apnea/hypopnea  Office Cleda Daub  04/24/2016 Ratio 52% Fev1 1.29L 40% predicted FVC 2.49L 58% predicted  CHIEF COMPLAINT:   Follow-up asthma/COPD Follow-up OSA Dx of OSA  HISTORY OF PRESENT ILLNESS    Asthma COPD seems to be controlled at this time  Patient takes Flovent inhaler 2 puffs twice daily  No signs of exacerbation over the last several years Plan is to decrease Flovent to 1 puff twice daily Rinse mouth after every use  No exacerbation at this time No evidence of heart failure at this time No evidence or signs of infection at this time No respiratory distress No fevers, chills, nausea, vomiting, diarrhea No evidence of lower extremity edema No evidence hemoptysis   Patient no longer uses CPAP for sleep apnea   +environmental exposure to burning wood, cold air  Is non-smoker No secondhand smoke exposure Very active Retired IT sales professional    Current Medication:  Current Outpatient Medications:    albuterol (PROVENTIL HFA) 108 (90 Base) MCG/ACT inhaler, Inhale 2 puffs into the lungs every 6 (six) hours as needed for wheezing or shortness of breath., Disp: 6.7 g, Rfl: 3   Coenzyme Q10 200 MG capsule, Take 200 mg by mouth daily., Disp: , Rfl:    Continuous Blood Gluc Sensor (FREESTYLE LIBRE 2 SENSOR) MISC, , Disp: , Rfl:    fluticasone (FLOVENT HFA) 110 MCG/ACT inhaler, Inhale 2 puffs into the lungs 2 (two) times daily., Disp: 12 g, Rfl: 5   GLUCAGON EMERGENCY 1 MG injection, Inject 1 mg into the muscle as needed. Inject if needed for hypoglycemia, Disp: , Rfl: 0   insulin aspart (NOVOLOG) 100 UNIT/ML FlexPen, Inject 30 Units into the skin 3 (three) times daily with meals. Patient reported once daily, Disp: , Rfl:    insulin glargine (LANTUS) 100 UNIT/ML injection, Inject 30 Units into the  skin at bedtime. Patient reported 20 units, Disp: , Rfl:    loratadine (CLARITIN) 10 MG tablet, Take 10 mg by mouth daily., Disp: , Rfl:  MEDICATIONS: I have reviewed all medications and confirmed regimen as documented  Active Ambulatory Problems    Diagnosis Date Noted   Bilateral hand pain 07/08/2015   Chronic fatigue, unspecified 07/08/2015   Chronic hand pain, right 10/07/2015   Ganglion cyst of joint of finger of right hand 10/07/2015   Resolved Ambulatory Problems    Diagnosis Date Noted   No Resolved Ambulatory Problems   Past Medical History:  Diagnosis Date   Diabetes (HCC)    Pneumonia    Past Surgical History:  Procedure Laterality Date   APPENDECTOMY     BACK SURGERY  1983   Family History  Problem Relation Age of Onset   Asthma Mother       ALLERGIES   Codeine      Review of Systems: Gen:  Denies  fever, sweats, chills weight loss  HEENT: Denies blurred vision, double vision, ear pain, eye pain, hearing loss, nose bleeds, sore throat Cardiac:  No dizziness, chest pain or heaviness, chest tightness,edema, No JVD Resp:   No cough, -sputum production, -shortness of breath,-wheezing, -hemoptysis,  Other:  All other systems negative  BP 132/70 (BP Location: Left Arm, Cuff Size: Normal)   Pulse 83   Temp 98.1 F (36.7 C) (Temporal)  Ht 5\' 5"  (1.651 m)   Wt 184 lb 12.8 oz (83.8 kg)   SpO2 97%   BMI 30.75 kg/m    Physical Examination:   General Appearance: No distress  EYES PERRLA, EOM intact.   NECK Supple, No JVD Pulmonary: normal breath sounds, No wheezing.  CardiovascularNormal S1,S2.  No m/r/g.   Abdomen: Benign, Soft, non-tender. ALL OTHER ROS ARE NEGATIVE     Office Spiro Ratio 52% Fev1 1.29L 40% predicted FVC 2.49L 58% predicted      Asthma COPD Assessment and plan  71 year old pleasant white male with asthma/COPD with underlying OSA  Follow up Asthma/COPD Intermittent reactive airways disease Patient advised to  continue Flovent however will try 1 puff twice daily Will decrease to one puff once daily and assess breathing status Albuterol as needed Avoid secondhand smoke exposure avoid burning with any environmental exposures  Patient states he feels well overall Therefore he has discontinued his CPAP machine I have a advised patient to obtain retesting however patient does not want to retest at this time    CURRENT MEDICATIONS REVIEWED AT LENGTH WITH PATIENT TODAY  Patient satisfied with Plan of action and management. All questions answered  Follow-up in 1 year  Total time spent 21 minutes  Malina Geers 09-30-1992, M.D.  [facility]  Medical Director [facility] Medical Director [facility] Cardio-Pulmonary Department

## 2021-09-27 ENCOUNTER — Telehealth: Payer: Self-pay | Admitting: Internal Medicine

## 2021-09-27 NOTE — Telephone Encounter (Signed)
noted 

## 2022-10-11 ENCOUNTER — Ambulatory Visit: Payer: HMO | Admitting: Internal Medicine

## 2022-10-11 ENCOUNTER — Encounter: Payer: Self-pay | Admitting: Internal Medicine

## 2022-10-11 VITALS — BP 124/86 | HR 63 | Temp 98.1°F | Ht 65.0 in | Wt 177.2 lb

## 2022-10-11 DIAGNOSIS — J4520 Mild intermittent asthma, uncomplicated: Secondary | ICD-10-CM

## 2022-10-11 NOTE — Patient Instructions (Signed)
Lets plan to use Flovent as needed Use albuterol as needed  Avoid secondhand smoke Avoid SICK contacts Recommend  Masking  when appropriate Recommend Keep up-to-date with vaccinations

## 2022-10-11 NOTE — Progress Notes (Signed)
[facility] [place] Pulmonary Medicine Consultation      Date: 10/11/2022,   MRN# [id number] Jason Cain 1950-11-15   Sleep Study Patient dx with OSA AHI was 27, 219 apnea/hypopnea  Office Cleda Daub  04/24/2016 Ratio 52% Fev1 1.29L 40% predicted FVC 2.49L 58% predicted  CHIEF COMPLAINT:   Follow-up asthma COPD Follow-up OSA  Dx of OSA  HISTORY OF PRESENT ILLNESS   Respiratory symptoms are controlled at this time Previous plan was to start Flovent 1 puff daily   No exacerbation at this time No evidence of heart failure at this time No evidence or signs of infection at this time No respiratory distress No fevers, chills, nausea, vomiting, diarrhea No evidence of lower extremity edema No evidence hemoptysis    Patient no longer uses CPAP for sleep apnea +environmental exposure to burning wood, cold air  Is non-smoker No secondhand smoke exposure Very active Retired IT sales professional    Current Medication:  Current Outpatient Medications:    albuterol (PROVENTIL HFA) 108 (90 Base) MCG/ACT inhaler, Inhale 2 puffs into the lungs every 6 (six) hours as needed for wheezing or shortness of breath., Disp: 6.7 g, Rfl: 3   Coenzyme Q10 200 MG capsule, Take 200 mg by mouth daily., Disp: , Rfl:    Continuous Blood Gluc Sensor (FREESTYLE LIBRE 2 SENSOR) MISC, , Disp: , Rfl:    fluticasone (FLOVENT HFA) 110 MCG/ACT inhaler, Inhale 2 puffs into the lungs 2 (two) times daily., Disp: 12 g, Rfl: 10   GLUCAGON EMERGENCY 1 MG injection, Inject 1 mg into the muscle as needed. Inject if needed for hypoglycemia, Disp: , Rfl: 0   insulin aspart (NOVOLOG) 100 UNIT/ML FlexPen, Inject 30 Units into the skin 3 (three) times daily with meals. Patient reported once daily, Disp: , Rfl:    insulin glargine (LANTUS) 100 UNIT/ML injection, Inject 30 Units into the skin at bedtime. Patient reported 20 units, Disp: , Rfl:    loratadine (CLARITIN) 10 MG tablet, Take 10 mg by mouth daily., Disp: ,  Rfl:  MEDICATIONS: I have reviewed all medications and confirmed regimen as documented  Active Ambulatory Problems    Diagnosis Date Noted   Bilateral hand pain 07/08/2015   Chronic fatigue, unspecified 07/08/2015   Chronic hand pain, right 10/07/2015   Ganglion cyst of joint of finger of right hand 10/07/2015   Resolved Ambulatory Problems    Diagnosis Date Noted   No Resolved Ambulatory Problems   Past Medical History:  Diagnosis Date   Diabetes (HCC)    Pneumonia    Past Surgical History:  Procedure Laterality Date   APPENDECTOMY     BACK SURGERY  1983   Family History  Problem Relation Age of Onset   Asthma Mother       ALLERGIES   Codeine   BP 124/86 (BP Location: Left Arm, Patient Position: Sitting, Cuff Size: Normal)   Pulse 63   Temp 98.1 F (36.7 C) (Temporal)   Ht 5\' 5"  (1.651 m)   Wt 177 lb 3.2 oz (80.4 kg)   SpO2 97%   BMI 29.49 kg/m    Review of Systems: Gen:  Denies  fever, sweats, chills weight loss  HEENT: Denies blurred vision, double vision, ear pain, eye pain, hearing loss, nose bleeds, sore throat Cardiac:  No dizziness, chest pain or heaviness, chest tightness,edema, No JVD Resp:   No cough, -sputum production, -shortness of breath,-wheezing, -hemoptysis,  Other:  All other systems negative  Physical Examination:   General Appearance: No distress  EYES PERRLA, EOM intact.   NECK Supple, No JVD Pulmonary: normal breath sounds, No wheezing.  CardiovascularNormal S1,S2.  No m/r/g.   Abdomen: Benign, Soft, non-tender. Neurology UE/LE 5/5 strength, no focal deficits Ext pulses intact, cap refill intact ALL OTHER ROS ARE NEGATIVE     Office Spiro Ratio 52% Fev1 1.29L 40% predicted FVC 2.49L 58% predicted    Asthma COPD Assessment and plan  71 year old pleasant white male with asthma/COPD with underlying OSA  Follow up Asthma/COPD-well-controlled Intermittent reactive airways disease Plan to use Flovent as needed Use  albuterol as needed Albuterol as needed Avoid secondhand smoke exposure avoid burning with any environmental exposures   Patient states he feels well overall Therefore he has discontinued his CPAP machine I have a advised patient to obtain retesting however patient does not want to retest at this time    CURRENT MEDICATIONS REVIEWED AT LENGTH WITH PATIENT TODAY Avoid secondhand smoke Avoid SICK contacts Recommend  Masking  when appropriate Recommend Keep up-to-date with vaccinations Lets plan to use Flovent and albuterol as needed  Patient satisfied with Plan of action and management. All questions answered  Follow-up in 1 year  Total time spent 22 minutes  Barbie Croston [facility], M.D.  [facility]  Medical Director [facility] Medical Director [facility] Cardio-Pulmonary Department

## 2023-09-13 ENCOUNTER — Other Ambulatory Visit: Payer: Self-pay | Admitting: Internal Medicine

## 2023-09-13 DIAGNOSIS — J449 Chronic obstructive pulmonary disease, unspecified: Secondary | ICD-10-CM

## 2023-09-13 MED ORDER — ALBUTEROL SULFATE HFA 108 (90 BASE) MCG/ACT IN AERS: 2.0000 | INHALATION_SPRAY | Freq: Four times a day (QID) | RESPIRATORY_TRACT | 3 refills | Status: DC | PRN

## 2023-09-13 NOTE — Telephone Encounter (Signed)
 Copied from CRM #[id number]. Topic: Clinical - Medication Refill >> Sep 13, 2023  3:45 PM Corean SAUNDERS wrote: Medication: PROVENTIL  HFA 108 (90 Base) MCG/ACT inhaler  Has the patient contacted their pharmacy? No, medication needs a new prescription. (Agent: If no, request that the patient contact the pharmacy for the refill. If patient does not wish to contact the pharmacy document the reason why and proceed with request.) (Agent: If yes, when and what did the pharmacy advise?)  This is the patient's preferred pharmacy:  [id number] [place], [place] - [address] AT [facility] OF [address] [place] [address] Phone: [phone] Fax: [phone]  Is this the correct pharmacy for this prescription? Yes If no, delete pharmacy and type the correct one.   Has the prescription been filled recently? No, patients wife is worried about patient not having this in case of an emergency.  Is the patient out of the medication? Yes  Has the patient been seen for an appointment in the last year OR does the patient have an upcoming appointment? Yes  Can we respond through MyChart? Yes  Agent: Please be advised that Rx refills [name] take up to 3 business days. We ask that you follow-up with your pharmacy.

## 2023-09-24 NOTE — Telephone Encounter (Signed)
 Copied from CRM [id number]. Topic: Clinical - Prescription Issue >> Sep 24, 2023  2:29 PM Rilla B wrote: Reason for CRM: Wife states patient has an emergency inhaler however, he has not had to use it since 2020. He is using the inhaler this week and she would like Dr Isaiah to send over a script for that inhaler. She states he is having to use it this week and don't think it will last til his appt on 10/09.  Please call wife @ [phone].   ----------------------------------------------------------------------- From previous Reason for Contact - Medication Refill: Medication:   Has the patient contacted their pharmacy?   (Agent: If no, request that the patient contact the pharmacy for the refill. If patient does not wish to contact the pharmacy document the reason why and proceed with request.) (Agent: If yes, when and what did the pharmacy advise?)  This is the patient's preferred pharmacy:  [id number] [place], [place] - [address] AT [facility] OF [address] [place] [address] Phone: [phone] Fax: [phone]  Is this the correct pharmacy for this prescription?   If no, delete pharmacy and type the correct one.   Has the prescription been filled recently?    Is the patient out of the medication?    Has the patient been seen for an appointment in the last year OR does the patient have an upcoming appointment?    Can we respond through MyChart?    Agent: Please be advised that Rx refills [place] take up to 3 business days. We ask that you follow-up with your pharmacy.

## 2023-10-18 ENCOUNTER — Encounter: Payer: Self-pay | Admitting: Internal Medicine

## 2023-10-18 ENCOUNTER — Ambulatory Visit: Admitting: Internal Medicine

## 2023-10-18 DIAGNOSIS — G4733 Obstructive sleep apnea (adult) (pediatric): Secondary | ICD-10-CM | POA: Diagnosis not present

## 2023-10-18 DIAGNOSIS — J4489 Other specified chronic obstructive pulmonary disease: Secondary | ICD-10-CM

## 2023-10-18 DIAGNOSIS — J449 Chronic obstructive pulmonary disease, unspecified: Secondary | ICD-10-CM

## 2023-10-18 MED ORDER — ALBUTEROL SULFATE HFA 108 (90 BASE) MCG/ACT IN AERS: 2.0000 | INHALATION_SPRAY | Freq: Four times a day (QID) | RESPIRATORY_TRACT | 10 refills | Status: AC | PRN

## 2023-10-18 NOTE — Patient Instructions (Signed)
 Continue to use albuterol  as needed No indication for prednisone antibiotics or restarting Flovent  at this time  Avoid Allergens and Irritants Avoid secondhand smoke Avoid SICK contacts Recommend  Masking  when appropriate Recommend Keep up-to-date with vaccinations

## 2023-10-18 NOTE — Progress Notes (Signed)
 [facility] [place] Pulmonary Medicine Consultation      Date: 10/18/2023,   MRN# [id number] Jason Cain [birth year]   Sleep Study Patient dx with OSA AHI was 27, 219 apnea/hypopnea  Office Spiro  04/24/2016 Ratio 52% Fev1 1.29L 40% predicted FVC 2.49L 58% predicted  CHIEF COMPLAINT:   Follow-up assess for asthma COPD Follow-up assessment for OSA-  Dx of OSA  HISTORY OF PRESENT ILLNESS   Respiratory symptoms are controlled at this time No exacerbation at this time No evidence of heart failure at this time No evidence or signs of infection at this time No respiratory distress No fevers, chills, nausea, vomiting, diarrhea No evidence of lower extremity edema No evidence hemoptysis Maintenance therapy with Flovent  inhaler  Patient no longer uses CPAP for sleep apnea +environmental exposure to burning wood, cold air  Is non-smoker No secondhand smoke exposure Very active Retired Investment banker, operational    Current Medication:  Current Outpatient Medications:    albuterol  (PROVENTIL  HFA) 108 (90 Base) MCG/ACT inhaler, Inhale 2 puffs into the lungs every 6 (six) hours as needed for wheezing or shortness of breath., Disp: 6.7 g, Rfl: 3   Coenzyme Q10 200 MG capsule, Take 200 mg by mouth daily., Disp: , Rfl:    Continuous Blood Gluc Sensor (FREESTYLE LIBRE 2 SENSOR) MISC, , Disp: , Rfl:    fluticasone  (FLOVENT  HFA) 110 MCG/ACT inhaler, Inhale 2 puffs into the lungs 2 (two) times daily. (Patient taking differently: Inhale 2 puffs into the lungs daily in the afternoon.), Disp: 12 g, Rfl: 10   GLUCAGON EMERGENCY 1 MG injection, Inject 1 mg into the muscle as needed. Inject if needed for hypoglycemia, Disp: , Rfl: 0   insulin aspart (NOVOLOG) 100 UNIT/ML FlexPen, Inject 30 Units into the skin 3 (three) times daily with meals. Patient reported once daily, Disp: , Rfl:    insulin glargine (LANTUS) 100 UNIT/ML injection, Inject 30 Units into the skin at bedtime., Disp: , Rfl:     loratadine (CLARITIN) 10 MG tablet, Take 10 mg by mouth daily., Disp: , Rfl:  MEDICATIONS: I have reviewed all medications and confirmed regimen as documented  Active Ambulatory Problems    Diagnosis Date Noted   Bilateral hand pain 07/08/2015   Chronic fatigue, unspecified 07/08/2015   Chronic hand pain, right 10/07/2015   Ganglion cyst of joint of finger of right hand 10/07/2015   Resolved Ambulatory Problems    Diagnosis Date Noted   No Resolved Ambulatory Problems   Past Medical History:  Diagnosis Date   Diabetes (HCC)    Pneumonia    Past Surgical History:  Procedure Laterality Date   APPENDECTOMY     BACK SURGERY  1983   Family History  Problem Relation Age of Onset   Asthma Mother       ALLERGIES   Codeine   BP (!) 130/90   Pulse 89   Temp 99.2 F (37.3 C)   Ht 5' 5 (1.651 m)   Wt 184 lb 12.8 oz (83.8 kg)   SpO2 96%   BMI 30.75 kg/m   Review of Systems: Gen:  Denies  fever, sweats, chills weight loss  HEENT: Denies blurred vision, double vision, ear pain, eye pain, hearing loss, nose bleeds, sore throat Cardiac:  No dizziness, chest pain or heaviness, chest tightness,edema, No JVD Resp:   No cough, -sputum production, -shortness of breath,-wheezing, -hemoptysis,  Other:  All other systems negative   Physical Examination:   General Appearance: No  distress  EYES PERRLA, EOM intact.   NECK Supple, No JVD Pulmonary: normal breath sounds, No wheezing.  CardiovascularNormal S1,S2.  No m/r/g.   Abdomen: Benign, Soft, non-tender. Neurology UE/LE 5/5 strength, no focal deficits Ext pulses intact, cap refill intact ALL OTHER ROS ARE NEGATIVE     Office Spiro Ratio 52% Fev1 1.29L 40% predicted FVC 2.49L 58% predicted    Asthma COPD Assessment and plan  73 year old pleasant white male with asthma/COPD with underlying OSA  Follow up Asthma/COPD-well-controlled Intermittent reactive airways disease Plan to use Flovent  as needed Albuterol  as  needed Avoid Allergens and Irritants Avoid secondhand smoke Avoid SICK contacts Recommend  Masking  when appropriate Recommend Keep up-to-date with vaccinations    Patient states he feels well overall Therefore he has discontinued his CPAP machine I have a advised patient to obtain retesting however patient does not want to retest at this time   CURRENT MEDICATIONS REVIEWED AT LENGTH WITH PATIENT TODAY   Patient  satisfied with Plan of action and management. All questions answered   Follow up 1 year   I spent a total of 30 minutes dedicated to the care of this patient on the date of this encounter to include pre-visit review of records, face-to-face time with the patient discussing conditions above, post visit ordering of testing, clinical documentation with the electronic health record, making appropriate referrals as documented, and communicating necessary information to the patient's healthcare team.    The Patient requires high complexity decision making for assessment and support, frequent evaluation and titration of therapies, application of advanced monitoring technologies and extensive interpretation of multiple databases.  Patient satisfied with Plan of action and management. All questions answered    Nickolas Alm Cellar, M.D.  [facility] Pulmonary & Critical Care Medicine  Medical Director [facility] [place]

## 2023-11-15 ENCOUNTER — Other Ambulatory Visit: Payer: Self-pay | Admitting: Internal Medicine

## 2023-11-15 DIAGNOSIS — J449 Chronic obstructive pulmonary disease, unspecified: Secondary | ICD-10-CM

## 2023-11-15 MED ORDER — FLUTICASONE PROPIONATE HFA 110 MCG/ACT IN AERO: 2.0000 | INHALATION_SPRAY | Freq: Two times a day (BID) | RESPIRATORY_TRACT | 10 refills | Status: AC

## 2023-11-15 NOTE — Telephone Encounter (Signed)
 Copied from CRM [id number]. Topic: Clinical - Medication Refill >> Nov 15, 2023  8:35 AM Corean SAUNDERS wrote: Medication: fluticasone  (FLOVENT  HFA) 110 MCG/ACT inhaler [[id number]]  Has the patient contacted their pharmacy? Yes, but medication has not been filled lately and requires a new prescription.  (Agent: If no, request that the patient contact the pharmacy for the refill. If patient does not wish to contact the pharmacy document the reason why and proceed with request.) (Agent: If yes, when and what did the pharmacy advise?)  This is the patient's preferred pharmacy:  [id number] [place], [place] - [address] AT [facility] OF [address] [place] [address] Phone: [phone] Fax: [phone]  Is this the correct pharmacy for this prescription? Yes If no, delete pharmacy and type the correct one.   Has the prescription been filled recently? No  Is the patient out of the medication? Yes  Has the patient been seen for an appointment in the last year OR does the patient have an upcoming appointment? Yes  Can we respond through MyChart? Yes  Agent: Please be advised that Rx refills [place] take up to 3 business days. We ask that you follow-up with your pharmacy.

## 2023-11-19 ENCOUNTER — Telehealth: Payer: Self-pay

## 2023-11-19 ENCOUNTER — Other Ambulatory Visit (HOSPITAL_COMMUNITY): Payer: Self-pay

## 2023-11-19 NOTE — Telephone Encounter (Signed)
*  Pulm  Pharmacy Patient Advocate Encounter   Received notification from CoverMyMeds that prior authorization for Fluticasone  Propionate HFA 110MCG/ACT aerosol  is required/requested.   Insurance verification completed.   The patient is insured through [facility] ADVANTAGE/RX ADVANCE.   Per test claim:  Brand Arnuity Ellipta, Asmanex Twisthaler, Asmanex HFA, Qvar Redihaler is preferred by the insurance.  If suggested medication is appropriate, Please send in a new RX and discontinue this one. If not, please advise as to why it's not appropriate so that we [place] request a Prior Authorization. Please note, some preferred medications [place] still require a PA.  If the suggested medications have not been trialed and there are no contraindications to their use, the PA will not be submitted, as it will not be approved.   Brand Arnuity Ellipta- $47.00 Asmanex Twisthaler- $113.29 Asmanex HFA- $113.35 Brand Qvar Redihaler- $47.00

## 2023-11-20 ENCOUNTER — Other Ambulatory Visit: Payer: Self-pay | Admitting: Internal Medicine

## 2023-11-20 DIAGNOSIS — J449 Chronic obstructive pulmonary disease, unspecified: Secondary | ICD-10-CM

## 2023-11-20 MED ORDER — ARNUITY ELLIPTA 200 MCG/ACT IN AEPB: 1.0000 | INHALATION_SPRAY | Freq: Every day | RESPIRATORY_TRACT | 5 refills | Status: AC

## 2023-11-20 NOTE — Progress Notes (Unsigned)
 Insurance company asking me to change inhalers, ordered arnuity

## 2023-11-20 NOTE — Telephone Encounter (Signed)
 I have notified the patient. Nothing further needed.
